# Patient Record
Sex: Male | Born: 1980 | Race: White | Hispanic: No | Marital: Single | Smoking: Current every day smoker
Health system: Southern US, Community
[De-identification: ages and names within clinical notes are randomized; demographics above are authoritative.]

## PROBLEM LIST (undated history)

## (undated) DIAGNOSIS — G459 Transient cerebral ischemic attack, unspecified: Principal | ICD-10-CM

---

## 2011-05-05 ENCOUNTER — Emergency Department: Payer: Self-pay | Admitting: Emergency Medicine

## 2011-08-13 ENCOUNTER — Emergency Department: Payer: Self-pay | Admitting: Emergency Medicine

## 2012-04-22 ENCOUNTER — Emergency Department: Payer: Self-pay | Admitting: Emergency Medicine

## 2012-07-19 ENCOUNTER — Emergency Department: Payer: Self-pay | Admitting: Emergency Medicine

## 2013-03-31 ENCOUNTER — Emergency Department: Payer: Self-pay | Admitting: Emergency Medicine

## 2013-12-30 ENCOUNTER — Emergency Department: Payer: Self-pay | Admitting: Emergency Medicine

## 2013-12-30 LAB — URINALYSIS, COMPLETE
BILIRUBIN, UR: NEGATIVE
BLOOD: NEGATIVE
GLUCOSE, UR: NEGATIVE mg/dL (ref 0–75)
Leukocyte Esterase: NEGATIVE
Nitrite: NEGATIVE
PH: 5 (ref 4.5–8.0)
Protein: NEGATIVE
RBC,UR: 2 /HPF (ref 0–5)
Specific Gravity: 1.031 (ref 1.003–1.030)
Squamous Epithelial: NONE SEEN
WBC UR: 1 /HPF (ref 0–5)

## 2014-10-16 ENCOUNTER — Emergency Department: Payer: Self-pay | Admitting: Emergency Medicine

## 2014-10-16 LAB — CBC WITH DIFFERENTIAL/PLATELET
Basophil #: 0.1 10*3/uL (ref 0.0–0.1)
Basophil %: 0.7 %
EOS PCT: 3.8 %
Eosinophil #: 0.5 10*3/uL (ref 0.0–0.7)
HCT: 48.7 % (ref 40.0–52.0)
HGB: 16.4 g/dL (ref 13.0–18.0)
Lymphocyte #: 2.5 10*3/uL (ref 1.0–3.6)
Lymphocyte %: 18.4 %
MCH: 30.5 pg (ref 26.0–34.0)
MCHC: 33.6 g/dL (ref 32.0–36.0)
MCV: 91 fL (ref 80–100)
MONOS PCT: 6.2 %
Monocyte #: 0.8 x10 3/mm (ref 0.2–1.0)
NEUTROS ABS: 9.5 10*3/uL — AB (ref 1.4–6.5)
NEUTROS PCT: 70.9 %
Platelet: 239 10*3/uL (ref 150–440)
RBC: 5.37 10*6/uL (ref 4.40–5.90)
RDW: 12.4 % (ref 11.5–14.5)
WBC: 13.4 10*3/uL — AB (ref 3.8–10.6)

## 2014-10-16 LAB — COMPREHENSIVE METABOLIC PANEL
ALK PHOS: 52 U/L
Albumin: 4.2 g/dL (ref 3.4–5.0)
Anion Gap: 7 (ref 7–16)
BUN: 9 mg/dL (ref 7–18)
Bilirubin,Total: 0.5 mg/dL (ref 0.2–1.0)
CO2: 29 mmol/L (ref 21–32)
CREATININE: 0.96 mg/dL (ref 0.60–1.30)
Calcium, Total: 9.3 mg/dL (ref 8.5–10.1)
Chloride: 103 mmol/L (ref 98–107)
EGFR (African American): >60
EGFR (Non-African Amer.): >60
Glucose: 118 mg/dL — ABNORMAL HIGH (ref 65–99)
Osmolality: 277 (ref 275–301)
Potassium: 3.7 mmol/L (ref 3.5–5.1)
SGOT(AST): 27 U/L (ref 15–37)
SGPT (ALT): 51 U/L
Sodium: 139 mmol/L (ref 136–145)
Total Protein: 7.8 g/dL (ref 6.4–8.2)

## 2014-10-16 LAB — LIPASE, BLOOD: Lipase: 81 U/L (ref 73–393)

## 2015-08-16 ENCOUNTER — Emergency Department
Admission: EM | Admit: 2015-08-16 | Discharge: 2015-08-16 | Disposition: A | Discharge status: Home / Self Care | Payer: Self-pay | Attending: Emergency Medicine | Admitting: Emergency Medicine

## 2015-08-16 DIAGNOSIS — R197 Diarrhea, unspecified: Secondary | ICD-10-CM | POA: Insufficient documentation

## 2015-08-16 DIAGNOSIS — R109 Unspecified abdominal pain: Secondary | ICD-10-CM | POA: Insufficient documentation

## 2015-08-16 DIAGNOSIS — Z0289 Encounter for other administrative examinations: Secondary | ICD-10-CM | POA: Insufficient documentation

## 2015-08-16 DIAGNOSIS — R112 Nausea with vomiting, unspecified: Secondary | ICD-10-CM | POA: Insufficient documentation

## 2015-08-16 DIAGNOSIS — L709 Acne, unspecified: Secondary | ICD-10-CM | POA: Insufficient documentation

## 2015-08-16 NOTE — ED Provider Notes (Signed)
[facility] Emergency Department Provider Note  ____________________________________________  Time seen: Approximately 8:16 PM  I have reviewed the triage vital signs and the nursing notes.   HISTORY  Chief Complaint Abdominal Cramping; Nausea; Acne; and Letter for School/Work    HPI Francis Roberts is a 34 y.o. male patient requests to return to work no secondary to have abdominal cramping this morning. Patient states the cramping has resolved but still having some residual nausea. Patient said he had 2 episodes of vomiting and diarrhea earlier this morning but none this evening. Patient states they wouldn't tolerate foods and fluids just feel weak patient denies any fevers chills associated with this complaint. No palliative measures taken for this complaint.   No past medical history on file.  There are no active problems to display for this patient.   No past surgical history on file.  No current outpatient prescriptions on file.  Allergies Review of patient's allergies indicates no known allergies.  No family history on file.  Social History Social History  Substance Use Topics  . Smoking status: Not on file  . Smokeless tobacco: Not on file  . Alcohol Use: Not on file    Review of Systems Constitutional: No fever/chills Eyes: No visual changes. ENT: No sore throat. Cardiovascular: Denies chest pain. Respiratory: Denies shortness of breath. Gastrointestinal:abdominal pain, nausea, vomiting and diarrhea.  No constipation. Genitourinary: Negative for dysuria. Musculoskeletal: Negative for back pain. Skin: Negative for rash. Neurological: Negative for headaches, focal weakness or numbness. Allergic/Immunilogical: 10-point ROS otherwise negative.  ____________________________________________   PHYSICAL EXAM:  VITAL SIGNS: ED Triage Vitals  Enc Vitals Group     BP 08/16/15 1933 133/83 mmHg     Pulse Rate 08/16/15 1933 85     Resp  08/16/15 1933 18     Temp 08/16/15 1933 98.4 F (36.9 C)     Temp Source 08/16/15 1933 Oral     SpO2 08/16/15 1933 98 %     Weight 08/16/15 1933 165 lb (74.844 kg)     Height 08/16/15 1933 5\' 9"  (1.753 m)     Head Cir --      Peak Flow --      Pain Score 08/16/15 1933 4     Pain Loc --      Pain Edu? --      Excl. in [place]? --     Constitutional: Alert and oriented. Well appearing and in no acute distress. Eyes: Conjunctivae are normal. PERRL. EOMI. Head: Atraumatic. Nose: No congestion/rhinnorhea. Mouth/Throat: Mucous membranes are moist.  Oropharynx non-erythematous. Neck: No stridor.  No cervical spine tenderness to palpation. Hematological/Lymphatic/Immunilogical: No cervical lymphadenopathy. Cardiovascular: Normal rate, regular rhythm. Grossly normal heart sounds.  Good peripheral circulation. Respiratory: Normal respiratory effort.  No retractions. Lungs CTAB. Gastrointestinal: Soft and nontender. No distention. No abdominal bruits. No CVA tenderness. Musculoskeletal: No lower extremity tenderness nor edema.  No joint effusions. Neurologic:  Normal speech and language. No gross focal neurologic deficits are appreciated. No gait instability. Skin:  Skin is warm, dry and intact. No rash noted. Psychiatric: Mood and affect are normal. Speech and behavior are normal.  ____________________________________________   LABS (all labs ordered are listed, but only abnormal results are displayed)  Labs Reviewed - No data to display ____________________________________________  EKG   ____________________________________________  RADIOLOGY   ____________________________________________   PROCEDURES  Procedure(s) performed: None  Critical Care performed: No  ____________________________________________   INITIAL IMPRESSION / ASSESSMENT AND PLAN / ED COURSE  Pertinent  labs & imaging results that were available during my care of the patient were reviewed by me and  considered in my medical decision making (see chart for details).  Nonspecific abdominal pain. Patient given a return to work note. Patient advised follow-up with "clinic. ____________________________________________   FINAL CLINICAL IMPRESSION(S) / ED DIAGNOSES  Final diagnoses:  Pain, abdominal, unknown etiology      Sable Feil, PA-C 08/16/15 2026  Carrie Mew, MD 08/16/15 2049

## 2015-08-16 NOTE — ED Notes (Signed)
Pt to ED states he missed work this morning for abdominal cramping this morning that caused him to miss work and has since been feeling better and that he needs a work note. Pt also requesting to have two places on his face evaluated where acne has been and is still raised.

## 2015-10-31 ENCOUNTER — Emergency Department
Admission: EM | Admit: 2015-10-31 | Discharge: 2015-10-31 | Disposition: A | Discharge status: Home / Self Care | Payer: Self-pay | Attending: Emergency Medicine | Admitting: Emergency Medicine

## 2015-10-31 ENCOUNTER — Encounter: Payer: Self-pay | Admitting: Emergency Medicine

## 2015-10-31 ENCOUNTER — Emergency Department: Payer: Self-pay

## 2015-10-31 DIAGNOSIS — F17210 Nicotine dependence, cigarettes, uncomplicated: Secondary | ICD-10-CM | POA: Insufficient documentation

## 2015-10-31 DIAGNOSIS — J069 Acute upper respiratory infection, unspecified: Secondary | ICD-10-CM | POA: Insufficient documentation

## 2015-10-31 DIAGNOSIS — R0602 Shortness of breath: Secondary | ICD-10-CM

## 2015-10-31 DIAGNOSIS — R202 Paresthesia of skin: Secondary | ICD-10-CM | POA: Insufficient documentation

## 2015-10-31 DIAGNOSIS — D170 Benign lipomatous neoplasm of skin and subcutaneous tissue of head, face and neck: Secondary | ICD-10-CM | POA: Insufficient documentation

## 2015-10-31 LAB — COMPREHENSIVE METABOLIC PANEL
ALBUMIN: 4.6 g/dL (ref 3.5–5.0)
ALT: 51 U/L (ref 17–63)
AST: 38 U/L (ref 15–41)
Alkaline Phosphatase: 51 U/L (ref 38–126)
Anion gap: 12 (ref 5–15)
BUN: 7 mg/dL (ref 6–20)
CHLORIDE: 104 mmol/L (ref 101–111)
CO2: 23 mmol/L (ref 22–32)
CREATININE: 1 mg/dL (ref 0.61–1.24)
Calcium: 10 mg/dL (ref 8.9–10.3)
GFR calc Af Amer: >60 mL/min (ref >60)
GLUCOSE: 93 mg/dL (ref 65–99)
Potassium: 3.4 mmol/L — ABNORMAL LOW (ref 3.5–5.1)
Sodium: 139 mmol/L (ref 135–145)
Total Bilirubin: 0.9 mg/dL (ref 0.3–1.2)
Total Protein: 7.8 g/dL (ref 6.5–8.1)

## 2015-10-31 LAB — CBC
HCT: 50.4 % (ref 40.0–52.0)
Hemoglobin: 17 g/dL (ref 13.0–18.0)
MCH: 29.8 pg (ref 26.0–34.0)
MCHC: 33.8 g/dL (ref 32.0–36.0)
MCV: 88.1 fL (ref 80.0–100.0)
PLATELETS: 240 10*3/uL (ref 150–440)
RBC: 5.72 MIL/uL (ref 4.40–5.90)
RDW: 12.4 % (ref 11.5–14.5)
WBC: 17.1 10*3/uL — AB (ref 3.8–10.6)

## 2015-10-31 LAB — TROPONIN I

## 2015-10-31 MED ORDER — BENZONATATE 100 MG PO CAPS
100.0000 mg | ORAL_CAPSULE | Freq: Four times a day (QID) | ORAL | Status: DC | PRN
Start: 2015-10-31 — End: 2016-07-01

## 2015-10-31 MED ORDER — AZITHROMYCIN 250 MG PO TABS: ORAL_TABLET | ORAL | Status: AC

## 2015-10-31 MED ORDER — ALBUTEROL SULFATE HFA 108 (90 BASE) MCG/ACT IN AERS: 2.0000 | INHALATION_SPRAY | Freq: Four times a day (QID) | RESPIRATORY_TRACT | Status: DC | PRN

## 2015-10-31 NOTE — Discharge Instructions (Signed)
Please drink plenty of fluid and stay well hydrated. Please practice frequent and good handwashing to prevent the spread of infection. Next  Please take the albuterol inhaler and the Tessalon Perles to decrease cough.   Please return to the emergency department if he developed chest pain, shortness of breath, inability to keep down fluids, or any other symptoms concerning to you.

## 2015-10-31 NOTE — ED Provider Notes (Signed)
[facility] Emergency Department Provider Note  ____________________________________________  Time seen: Approximately 6:53 PM  I have reviewed the triage vital signs and the nursing notes.   HISTORY  Chief Complaint Shortness of Breath; Nasal Congestion; and Numbness    HPI Francis Roberts is a 34 y.o. male with ongoing tobacco abuse presenting with several days of cough productive of yellow sputum, congestion and rhinorrhea, and shortness of breath. Patient reports that this morning shortness of breath was worse in the hot environment of his workplace at [employer], so he came here for evaluation. As he noticed that he was short of breath, he started to get worried and began to breathe very fast, and then he developed tingling of both hands, both feet, and the upper lip. This has completely resolved. He denies any lightheadedness or syncope. He has had occasional midsternal chest "tightness" that occurs with the cough. He had one day of sore throat which has completely resolved. He denies any ear pain. He denies any fever or chills. No myalgias.   History reviewed. No pertinent past medical history.  There are no active problems to display for this patient.   History reviewed. No pertinent past surgical history.  Current Outpatient Rx  Name  Route  Sig  Dispense  Refill  . albuterol (PROVENTIL HFA;VENTOLIN HFA) 108 (90 BASE) MCG/ACT inhaler   Inhalation   Inhale 2 puffs into the lungs every 6 (six) hours as needed for wheezing or shortness of breath.   1 Inhaler   0   . azithromycin (ZITHROMAX Z-PAK) 250 MG tablet      Take 2 tablets (500 mg) on  Day 1,  followed by 1 tablet (250 mg) once daily on Days 2 through 5.   6 each   0   . benzonatate (TESSALON PERLES) 100 MG capsule   Oral   Take 1 capsule (100 mg total) by mouth every 6 (six) hours as needed for cough.   30 capsule   0     Allergies Review of patient's allergies indicates no known  allergies.  No family history on file.  Social History Social History  Substance Use Topics  . Smoking status: Current Every Day Smoker    Types: Cigarettes  . Smokeless tobacco: None  . Alcohol Use: No    Review of Systems Constitutional: No fever/chills. No lightheadedness or syncope. Eyes: No visual changes. ENT: Positive sore throat now resolved. Cardiovascular: Positive chest tightness but no chest pain or palpitations. Respiratory: Positive shortness of breath.  Positive productive cough. Gastrointestinal: No abdominal pain.  No nausea, no vomiting.  No diarrhea.  No constipation. Genitourinary: Negative for dysuria. Musculoskeletal: Negative for back pain. Skin: Negative for rash. Neurological: Negative for headaches, focal weakness or numbness. Tingling perioral, bilateral hands, bilateral feet.  10-point ROS otherwise negative.  ____________________________________________   PHYSICAL EXAM:  VITAL SIGNS: ED Triage Vitals  Enc Vitals Group     BP 10/31/15 1726 128/74 mmHg     Pulse Rate 10/31/15 1726 89     Resp 10/31/15 1726 22     Temp 10/31/15 1726 98.6 F (37 C)     Temp Source 10/31/15 1726 Oral     SpO2 10/31/15 1726 97 %     Weight 10/31/15 1726 169 lb (76.658 kg)     Height 10/31/15 1726 5\' 9"  (1.753 m)     Head Cir --      Peak Flow --      Pain Score  10/31/15 1822 0     Pain Loc --      Pain Edu? --      Excl. in [place]? --     Constitutional: Alert and oriented. Well appearing and in no acute distress. Answer question appropriately. Eyes: Conjunctivae are normal.  EOMI. no discharge. EARS: TMs are clear without bulge, erythema or fluid bilaterally. Canals are clear. Head: Atraumatic. Nose: Positive congestion, no rhinorrhea. Mouth/Throat: Mucous membranes are moist. No posterior pharyngeal erythema, tonsillar swelling or exudate. Uvula is midline.  Neck: No stridor.  Supple.  No JVD. Trachea is midline. Cardiovascular: Normal rate, regular  rhythm. No murmurs, rubs or gallops.  Respiratory: Normal respiratory effort.  No retractions. Lungs CTAB.  No wheezes, rales or ronchi. Gastrointestinal: Soft and nontender. No distention. No peritoneal signs. Musculoskeletal: No LE edema. No calf pain, palpable cords or Homans sign. Neurologic:  Normal speech and language. No gross focal neurologic deficits are appreciated.  Skin:  Skin is warm, dry and intact. No rash noted. Psychiatric: Mood and affect are normal. Speech and behavior are normal.  Normal judgement.  ____________________________________________   LABS (all labs ordered are listed, but only abnormal results are displayed)  Labs Reviewed  CBC - Abnormal; Notable for the following:    WBC 17.1 (*)    All other components within normal limits  COMPREHENSIVE METABOLIC PANEL - Abnormal; Notable for the following:    Potassium 3.4 (*)    All other components within normal limits  TROPONIN I   ____________________________________________  EKG  ED ECG REPORT I, Eula Listen, the attending physician, personally viewed and interpreted this ECG.   Date: 10/31/2015  EKG Time: 1731  Rate: 83  Rhythm: normal sinus rhythm  Axis: normal  Intervals:none  ST&T Change: No ST elevation. Nonspecific T-wave inversion in V1.  ____________________________________________  RADIOLOGY  Dg Chest 2 View  10/31/2015  CLINICAL DATA:  Cough, shortness of breath for several days. EXAM: CHEST  2 VIEW COMPARISON:  None. FINDINGS: The heart size and mediastinal contours are within normal limits. Both lungs are clear. No pneumothorax or pleural effusion is noted. The visualized skeletal structures are unremarkable. IMPRESSION: No active cardiopulmonary disease. Electronically Signed   By: Marijo Conception, M.D.   On: 10/31/2015 17:49    ____________________________________________   PROCEDURES  Procedure(s) performed: None  Critical Care performed:  No ____________________________________________   INITIAL IMPRESSION / ASSESSMENT AND PLAN / ED COURSE  Pertinent labs & imaging results that were available during my care of the patient were reviewed by me and considered in my medical decision making (see chart for details).  34 y.o. male with ongoing tobacco abuse presenting with several days of cough and cold symptoms without fever, positive shortness of breath. Here, the patient has 97% oxygen saturation with a normal respiratory rate, no retractions or accessory muscle use. I do not hear any abnormal sounds in his lungs and his chest x-ray does not show pneumonia; however, given his productive cough and shortness of breath all treat him with a Z-Pak. He understands that the etiology of his symptoms is most likely viral and that it is possible the Z-Pak will not improve his symptoms. He understands return precautions and follow-up instructions.  ____________________________________________  FINAL CLINICAL IMPRESSION(S) / ED DIAGNOSES  Final diagnoses:  Upper respiratory infection  Shortness of breath  Lipoma of forehead  Tingling in extremities      NEW MEDICATIONS STARTED DURING THIS VISIT:  New Prescriptions   ALBUTEROL (PROVENTIL HFA;VENTOLIN  HFA) 108 (90 BASE) MCG/ACT INHALER    Inhale 2 puffs into the lungs every 6 (six) hours as needed for wheezing or shortness of breath.   AZITHROMYCIN (ZITHROMAX Z-PAK) 250 MG TABLET    Take 2 tablets (500 mg) on  Day 1,  followed by 1 tablet (250 mg) once daily on Days 2 through 5.   BENZONATATE (TESSALON PERLES) 100 MG CAPSULE    Take 1 capsule (100 mg total) by mouth every 6 (six) hours as needed for cough.     Eula Listen, MD 10/31/15 1902

## 2015-10-31 NOTE — ED Notes (Signed)
Pt reports nasal congestion and shortness of breath x "a few days", reports bilateral hand and foot numbness today. Pt ambulatory to triage.

## 2015-11-01 MED ORDER — OXYCODONE-ACETAMINOPHEN 5-325 MG PO TABS
ORAL_TABLET | ORAL | Status: AC
  Filled 2015-11-01: qty 2

## 2016-07-01 ENCOUNTER — Encounter: Payer: Self-pay | Admitting: Emergency Medicine

## 2016-07-01 ENCOUNTER — Emergency Department
Admission: EM | Admit: 2016-07-01 | Discharge: 2016-07-01 | Disposition: A | Discharge status: Home / Self Care | Payer: Self-pay | Attending: Emergency Medicine | Admitting: Emergency Medicine

## 2016-07-01 DIAGNOSIS — Y9389 Activity, other specified: Secondary | ICD-10-CM | POA: Insufficient documentation

## 2016-07-01 DIAGNOSIS — Y999 Unspecified external cause status: Secondary | ICD-10-CM | POA: Insufficient documentation

## 2016-07-01 DIAGNOSIS — W51XXXA Accidental striking against or bumped into by another person, initial encounter: Secondary | ICD-10-CM | POA: Insufficient documentation

## 2016-07-01 DIAGNOSIS — S39012A Strain of muscle, fascia and tendon of lower back, initial encounter: Secondary | ICD-10-CM | POA: Insufficient documentation

## 2016-07-01 DIAGNOSIS — Y929 Unspecified place or not applicable: Secondary | ICD-10-CM | POA: Insufficient documentation

## 2016-07-01 DIAGNOSIS — F17210 Nicotine dependence, cigarettes, uncomplicated: Secondary | ICD-10-CM | POA: Insufficient documentation

## 2016-07-01 MED ORDER — KETOROLAC TROMETHAMINE 60 MG/2ML IM SOLN
60.0000 mg | Freq: Once | INTRAMUSCULAR | Status: AC
  Administered 2016-07-01: 60 mg via INTRAMUSCULAR
  Filled 2016-07-01: qty 2

## 2016-07-01 MED ORDER — HYDROCODONE-ACETAMINOPHEN 5-325 MG PO TABS: 1.0000 | ORAL_TABLET | ORAL | Status: DC | PRN

## 2016-07-01 MED ORDER — NAPROXEN 500 MG PO TABS: 500.0000 mg | ORAL_TABLET | Freq: Two times a day (BID) | ORAL | Status: DC

## 2016-07-01 MED ORDER — CYCLOBENZAPRINE HCL 10 MG PO TABS: 10.0000 mg | ORAL_TABLET | Freq: Three times a day (TID) | ORAL | Status: DC | PRN

## 2016-07-01 NOTE — Discharge Instructions (Signed)
Lumbosacral Strain °Lumbosacral strain is a strain of any of the parts that make up your lumbosacral vertebrae. Your lumbosacral vertebrae are the bones that make up the lower third of your backbone. Your lumbosacral vertebrae are held together by muscles and tough, fibrous tissue (ligaments).  °CAUSES  °A sudden blow to your back can cause lumbosacral strain. Also, anything that causes an excessive stretch of the muscles in the low back can cause this strain. This is typically seen when people exert themselves strenuously, fall, lift heavy objects, bend, or crouch repeatedly. °RISK FACTORS °· Physically demanding work. °· Participation in pushing or pulling sports or sports that require a sudden twist of the back (tennis, golf, baseball). °· Weight lifting. °· Excessive lower back curvature. °· Forward-tilted pelvis. °· Weak back or abdominal muscles or both. °· Tight hamstrings. °SIGNS AND SYMPTOMS  °Lumbosacral strain may cause pain in the area of your injury or pain that moves (radiates) down your leg.  °DIAGNOSIS °Your health care provider can often diagnose lumbosacral strain through a physical exam. In some cases, you may need tests such as X-ray exams.  °TREATMENT  °Treatment for your lower back injury depends on many factors that your clinician will have to evaluate. However, most treatment will include the use of anti-inflammatory medicines. °HOME CARE INSTRUCTIONS  °· Avoid hard physical activities (tennis, racquetball, waterskiing) if you are not in proper physical condition for it. This may aggravate or create problems. °· If you have a back problem, avoid sports requiring sudden body movements. Swimming and walking are generally safer activities. °· Maintain good posture. °· Maintain a healthy weight. °· For acute conditions, you may put ice on the injured area. °¨ Put ice in a plastic bag. °¨ Place a towel between your skin and the bag. °¨ Leave the ice on for 20 minutes, 2-3 times a day. °· When the  low back starts healing, stretching and strengthening exercises may be recommended. °SEEK MEDICAL CARE IF: °· Your back pain is getting worse. °· You experience severe back pain not relieved with medicines. °SEEK IMMEDIATE MEDICAL CARE IF:  °· You have numbness, tingling, weakness, or problems with the use of your arms or legs. °· There is a change in bowel or bladder control. °· You have increasing pain in any area of the body, including your belly (abdomen). °· You notice shortness of breath, dizziness, or feel faint. °· You feel sick to your stomach (nauseous), are throwing up (vomiting), or become sweaty. °· You notice discoloration of your toes or legs, or your feet get very cold. °MAKE SURE YOU:  °· Understand these instructions. °· Will watch your condition. °· Will get help right away if you are not doing well or get worse. °  °This information is not intended to replace advice given to you by your health care provider. Make sure you discuss any questions you have with your health care provider. °  °Document Released: 09/10/2005 Document Revised: 12/22/2014 Document Reviewed: 07/20/2013 °Elsevier Interactive Patient Education ©2016 [employer] ° °Muscle Strain °A muscle strain is an injury that occurs when a muscle is stretched beyond its normal length. Usually a small number of muscle fibers are torn when this happens. Muscle strain is rated in degrees. First-degree strains have the least amount of muscle fiber tearing and pain. Second-degree and third-degree strains have increasingly more tearing and pain.  °Usually, recovery from muscle strain takes 1-2 weeks. Complete healing takes 5-6 weeks.  °CAUSES  °Muscle strain happens when   a sudden, violent force placed on a muscle stretches it too far. This may occur with lifting, sports, or a fall.  °RISK FACTORS °Muscle strain is especially common in athletes.  °SIGNS AND SYMPTOMS °At the site of the muscle strain, there may  be: °· Pain. °· Bruising. °· Swelling. °· Difficulty using the muscle due to pain or lack of normal function. °DIAGNOSIS  °Your health care provider will perform a physical exam and ask about your medical history. °TREATMENT  °Often, the best treatment for a muscle strain is resting, icing, and applying cold compresses to the injured area.   °HOME CARE INSTRUCTIONS  °· Use the PRICE method of treatment to promote muscle healing during the first 2-3 days after your injury. The PRICE method involves: °¨ Protecting the muscle from being injured again. °¨ Restricting your activity and resting the injured body part. °¨ Icing your injury. To do this, put ice in a plastic bag. Place a towel between your skin and the bag. Then, apply the ice and leave it on from 15-20 minutes each hour. After the third day, switch to moist heat packs. °¨ Apply compression to the injured area with a splint or elastic bandage. Be careful not to wrap it too tightly. This may interfere with blood circulation or increase swelling. °¨ Elevate the injured body part above the level of your heart as often as you can. °· Only take over-the-counter or prescription medicines for pain, discomfort, or fever as directed by your health care provider. °· Warming up prior to exercise helps to prevent future muscle strains. °SEEK MEDICAL CARE IF:  °· You have increasing pain or swelling in the injured area. °· You have numbness, tingling, or a significant loss of strength in the injured area. °MAKE SURE YOU:  °· Understand these instructions. °· Will watch your condition. °· Will get help right away if you are not doing well or get worse. °  °This information is not intended to replace advice given to you by your health care provider. Make sure you discuss any questions you have with your health care provider. °  °Document Released: 12/01/2005 Document Revised: 09/21/2013 Document Reviewed: 06/30/2013 °Elsevier Interactive Patient Education ©2016 Elsevier  Inc. ° °

## 2016-07-01 NOTE — ED Provider Notes (Signed)
[facility] Emergency Department Provider Note  ____________________________________________  Time seen: Approximately 8:26 AM  I have reviewed the triage vital signs and the nursing notes.   HISTORY  Chief Complaint Back Pain    HPI Francis Roberts is a 35 y.o. male presents for evaluation of lower back pain. Patient states that he was playing around with his young son jumped on his back yesterday complains of increased pain radiating down both legs. States his pains as 10 over 10 at this time exacerbated with walking.   History reviewed. No pertinent past medical history.  There are no active problems to display for this patient.   History reviewed. No pertinent past surgical history.  Current Outpatient Rx  Name  Route  Sig  Dispense  Refill  . albuterol (PROVENTIL HFA;VENTOLIN HFA) 108 (90 BASE) MCG/ACT inhaler   Inhalation   Inhale 2 puffs into the lungs every 6 (six) hours as needed for wheezing or shortness of breath.   1 Inhaler   0   . cyclobenzaprine (FLEXERIL) 10 MG tablet   Oral   Take 1 tablet (10 mg total) by mouth 3 (three) times daily as needed for muscle spasms.   30 tablet   0   . HYDROcodone-acetaminophen (NORCO) 5-325 MG tablet   Oral   Take 1-2 tablets by mouth every 4 (four) hours as needed for moderate pain.   10 tablet   0   . naproxen (NAPROSYN) 500 MG tablet   Oral   Take 1 tablet (500 mg total) by mouth 2 (two) times daily with a meal.   60 tablet   0     Allergies Review of patient's allergies indicates no known allergies.  No family history on file.  Social History Social History  Substance Use Topics  . Smoking status: Current Every Day Smoker    Types: Cigarettes  . Smokeless tobacco: None  . Alcohol Use: No    Review of Systems Constitutional: No fever/chills Cardiovascular: Denies chest pain. Respiratory: Denies shortness of breath. Gastrointestinal: No abdominal pain.  No nausea, no  vomiting.  No diarrhea.  No constipation. Genitourinary: Negative for dysuria. Musculoskeletal: Positive low back pain. Straight leg raise negative bilaterally Skin: Negative for rash. Neurological: Negative for headaches, focal weakness or numbness.  10-point ROS otherwise negative.  ____________________________________________   PHYSICAL EXAM:  VITAL SIGNS: ED Triage Vitals  Enc Vitals Group     BP --      Pulse --      Resp --      Temp --      Temp src --      SpO2 --      Weight --      Height --      Head Cir --      Peak Flow --      Pain Score 07/01/16 0820 10     Pain Loc --      Pain Edu? --      Excl. in [place]? --     Constitutional: Alert and oriented. Well appearing and in no acute distress.   Cardiovascular: Normal rate, regular rhythm. Grossly normal heart sounds.  Good peripheral circulation. Respiratory: Normal respiratory effort.  No retractions. Lungs CTAB. Musculoskeletal: Thoracic and lumbar paraspinal tenderness. No CVAT. Trachea leg raise negative bilaterally. Neurologic:  Normal speech and language. No gross focal neurologic deficits are appreciated. Ambulates slowly methodically secondary to lower back pain. Skin:  Skin is warm, dry and intact.  No rash noted. Psychiatric: Mood and affect are normal. Speech and behavior are normal.  ____________________________________________   LABS (all labs ordered are listed, but only abnormal results are displayed)  Labs Reviewed - No data to display ____________________________________________  EKG   ____________________________________________  RADIOLOGY  Deferred at this visit ____________________________________________   PROCEDURES  Procedure(s) performed: None  Critical Care performed: No  ____________________________________________   INITIAL IMPRESSION / ASSESSMENT AND PLAN / ED COURSE  Pertinent labs & imaging results that were available during my care of the patient were  reviewed by me and considered in my medical decision making (see chart for details).  Acute lumbar sacral strain. Rx given for Flexeril 10 mg 3 times a day, Naprosyn 500 mg twice a day. Patient follow-up with PCP or return to ER with any worsening symptomology. Work excuse 24 hours given. ____________________________________________   FINAL CLINICAL IMPRESSION(S) / ED DIAGNOSES  Final diagnoses:  Low back strain, initial encounter     This chart was dictated using voice recognition software/Dragon. Despite best efforts to proofread, errors can occur which can change the meaning. Any change was purely unintentional.   Arlyss Repress, PA-C 07/01/16 [id number]  Drenda Freeze, MD 07/01/16 [phone]

## 2016-07-01 NOTE — ED Notes (Signed)
Reports lower back pain.  States he was playing with his young con who jumped on his back yesterday, pain since

## 2016-08-12 ENCOUNTER — Emergency Department
Admission: EM | Admit: 2016-08-12 | Discharge: 2016-08-12 | Disposition: A | Discharge status: Home / Self Care | Payer: Self-pay | Attending: Emergency Medicine | Admitting: Emergency Medicine

## 2016-08-12 ENCOUNTER — Encounter: Payer: Self-pay | Admitting: Emergency Medicine

## 2016-08-12 DIAGNOSIS — F17210 Nicotine dependence, cigarettes, uncomplicated: Secondary | ICD-10-CM | POA: Insufficient documentation

## 2016-08-12 DIAGNOSIS — Z79899 Other long term (current) drug therapy: Secondary | ICD-10-CM | POA: Insufficient documentation

## 2016-08-12 DIAGNOSIS — R112 Nausea with vomiting, unspecified: Secondary | ICD-10-CM | POA: Insufficient documentation

## 2016-08-12 DIAGNOSIS — R1032 Left lower quadrant pain: Secondary | ICD-10-CM | POA: Insufficient documentation

## 2016-08-12 LAB — COMPREHENSIVE METABOLIC PANEL
ALBUMIN: 4.6 g/dL (ref 3.5–5.0)
ALT: 24 U/L (ref 17–63)
ANION GAP: 3 — AB (ref 5–15)
AST: 22 U/L (ref 15–41)
Alkaline Phosphatase: 44 U/L (ref 38–126)
BILIRUBIN TOTAL: 0.4 mg/dL (ref 0.3–1.2)
BUN: 9 mg/dL (ref 6–20)
CO2: 26 mmol/L (ref 22–32)
Calcium: 9.5 mg/dL (ref 8.9–10.3)
Chloride: 107 mmol/L (ref 101–111)
Creatinine, Ser: 0.91 mg/dL (ref 0.61–1.24)
GLUCOSE: 80 mg/dL (ref 65–99)
POTASSIUM: 3.8 mmol/L (ref 3.5–5.1)
Sodium: 136 mmol/L (ref 135–145)
TOTAL PROTEIN: 7.5 g/dL (ref 6.5–8.1)

## 2016-08-12 LAB — URINALYSIS COMPLETE WITH MICROSCOPIC (ARMC ONLY)
BILIRUBIN URINE: NEGATIVE
Bacteria, UA: NONE SEEN
Glucose, UA: NEGATIVE mg/dL
Hgb urine dipstick: NEGATIVE
KETONES UR: NEGATIVE mg/dL
Leukocytes, UA: NEGATIVE
Nitrite: NEGATIVE
PROTEIN: NEGATIVE mg/dL
RBC / HPF: NONE SEEN RBC/hpf (ref 0–5)
Specific Gravity, Urine: 1.009 (ref 1.005–1.030)
Squamous Epithelial / LPF: NONE SEEN
WBC, UA: NONE SEEN WBC/hpf (ref 0–5)
pH: 7 (ref 5.0–8.0)

## 2016-08-12 LAB — CBC
HEMATOCRIT: 46.3 % (ref 40.0–52.0)
HEMOGLOBIN: 16.5 g/dL (ref 13.0–18.0)
MCH: 30.7 pg (ref 26.0–34.0)
MCHC: 35.6 g/dL (ref 32.0–36.0)
MCV: 86.3 fL (ref 80.0–100.0)
PLATELETS: 217 10*3/uL (ref 150–440)
RBC: 5.37 MIL/uL (ref 4.40–5.90)
RDW: 12.3 % (ref 11.5–14.5)
WBC: 8.6 10*3/uL (ref 3.8–10.6)

## 2016-08-12 LAB — LIPASE, BLOOD: Lipase: 25 U/L (ref 11–51)

## 2016-08-12 MED ORDER — ONDANSETRON HCL 4 MG PO TABS
ORAL_TABLET | ORAL | Status: AC
  Administered 2016-08-12: 4 mg via ORAL
  Filled 2016-08-12: qty 1

## 2016-08-12 MED ORDER — SODIUM CHLORIDE 0.9 % IV BOLUS (SEPSIS): 1000.0000 mL | Freq: Once | INTRAVENOUS | Status: DC

## 2016-08-12 MED ORDER — ONDANSETRON HCL 4 MG PO TABS
4.0000 mg | ORAL_TABLET | Freq: Once | ORAL | Status: AC
  Administered 2016-08-12: 4 mg via ORAL

## 2016-08-12 MED ORDER — ONDANSETRON HCL 4 MG/2ML IJ SOLN
4.0000 mg | Freq: Once | INTRAMUSCULAR | Status: DC
  Filled 2016-08-12: qty 2

## 2016-08-12 NOTE — ED Notes (Signed)
Pt given water for PO challenge per MD request.

## 2016-08-12 NOTE — ED Triage Notes (Signed)
Pt presents from work with abdominal pain and n/v. He states he felt poorly yesterday and began to have RLQ pain and vomiting this morning. Pt's girlfriend had similar symptoms yesterday. NAD Noted.

## 2016-08-12 NOTE — ED Provider Notes (Signed)
[facility] Emergency Department Provider Note  _____________________________________   First MD Initiated Contact with Patient 08/12/16 1204     (approximate)  I have reviewed the triage vital signs and the nursing notes.   HISTORY  Chief Complaint Abdominal Pain and Emesis  HPI Francis Roberts is a 35 y.o. male no significant past medical history who presents to the ER after he began vomiting at 8 AM today. He had 2 episodes of vomiting followed by 4 episodes of dry heaves and no longer feels nauseous but does say that his stomach is still "in a ball". He had lower abdominal cramping first right lower quadrant than left lower quadrant and now is very minimal; right lower quadrant no longer hurts. His girlfriend had identical symptoms yesterday afternoon and slept for 4 hours and now feels better. They both had one episode of loose stool, no diarrhea. Neither reported fever. They did eat [place] food yesterday.   History reviewed. No pertinent past medical history.  There are no active problems to display for this patient.   History reviewed. No pertinent surgical history.  Prior to Admission medications   Medication Sig Start Date End Date Taking? Authorizing Provider  albuterol (PROVENTIL HFA;VENTOLIN HFA) 108 (90 BASE) MCG/ACT inhaler Inhale 2 puffs into the lungs every 6 (six) hours as needed for wheezing or shortness of breath. Patient not taking: Reported on 08/12/2016 10/31/15   Eula Listen, MD  cyclobenzaprine (FLEXERIL) 10 MG tablet Take 1 tablet (10 mg total) by mouth 3 (three) times daily as needed for muscle spasms. Patient not taking: Reported on 08/12/2016 07/01/16   Pierce Crane Beers, PA-C  HYDROcodone-acetaminophen [facility]) 5-325 MG tablet Take 1-2 tablets by mouth every 4 (four) hours as needed for moderate pain. Patient not taking: Reported on 08/12/2016 07/01/16   Pierce Crane Beers, PA-C  naproxen (NAPROSYN) 500 MG tablet Take 1 tablet (500  mg total) by mouth 2 (two) times daily with a meal. Patient not taking: Reported on 08/12/2016 07/01/16   Arlyss Repress, PA-C    Allergies Review of patient's allergies indicates no known allergies.  History reviewed. No pertinent family history.  Social History Social History  Substance Use Topics  . Smoking status: Current Every Day Smoker    Packs/day: 0.50    Types: Cigarettes  . Smokeless tobacco: Never Used  . Alcohol use No    Review of SystemsConstitutional: No fever/chills  Cardiovascular: Denies chest pain. Respiratory: Denies shortness of breath. Gastrointestinal see history of present illness Genitourinary: Negative for dysuria. Musculoskeletal: Negative for back pain. Skin: Negative for rash. Neurological: Negative for headaches10-point ROS otherwise negative.  ____________________________________________   PHYSICAL EXAM:  VITAL SIGNS: ED Triage Vitals [08/12/16 0856]  Enc Vitals Group     BP 140/83     Pulse Rate 85     Resp 18     Temp 98.4 F (36.9 C)     Temp Source Oral     SpO2 98 %     Weight 170 lb (77.1 kg)     Height 5\' 9"  (1.753 m)     Head Circumference      Peak Flow      Pain Score 6     Pain Loc      Pain Edu?      Excl. in [place]?    Constitutional: Alert and oriented. Well appearing and in no acute distress. Eyes: Conjunctivae are normal. PERRL. EOMI. Head: Atraumatic. Nose: No congestion/rhinnorhea. Mouth/Throat: Mucous membranes  are Slightly dry.  Oropharynx non-erythematous. Neck: No stridor.   Cardiovascular: Normal rate, regular rhythm. Grossly normal heart sounds.  Good peripheral circulation. Respiratory: Normal respiratory effort.  No retractions. Lungs CTAB. Gastrointestinal: Soft and minimally tender to palpation left lower quadrant. No distention. No abdominal bruits. No CVA tenderness. Musculoskeletal: No lower extremity tenderness nor edema.  No joint effusions. Neurologic:  Normal speech and language. No gross  focal neurologic deficits are appreciated. No gait instability. Skin:  Skin is warm, dry and intact. No rash noted. Psychiatric: Mood and affect are normal. Speech and behavior are normal.  ____________________________________________   LABS (all labs ordered are listed, but only abnormal results are displayed)  Labs Reviewed  COMPREHENSIVE METABOLIC PANEL - Abnormal; Notable for the following:       Result Value   Anion gap 3 (*)    All other components within normal limits  URINALYSIS COMPLETEWITH MICROSCOPIC ([facility] ONLY) - Abnormal; Notable for the following:    Color, Urine YELLOW (*)    APPearance CLEAR (*)    All other components within normal limits  LIPASE, BLOOD  CBC    Procedures_n/a___________________________________________   INITIAL IMPRESSION / ASSESSMENT AND PLAN / ED COURSE  Pertinent labs & imaging results that were available during my care of the patient were reviewed by me and considered in my medical decision making (see chart for details).  On exam, patient with no tenderness right lower quadrant, very minimal tenderness left lower quadrant. Symptoms consistent with what his girlfriend had yesterday. I strongly suspect this is viral or food borne. I do not feel the patient needs labs or imaging for further workup.  Clinical Course    ----------------------------------------- 2:04 PM on 08/12/2016 -----------------------------------------  Patient is tolerating liquids by mouth. He had previously refused IV fluids and IV Zofran as Sx have resolved & he is feeling better. He is walking around and requesting discharge.  ____________________________________________   FINAL CLINICAL IMPRESSION(S) / ED DIAGNOSES  Final diagnoses:  Non-intractable vomiting with nausea, vomiting of unspecified type      NEW MEDICATIONS STARTED DURING THIS VISIT:  New Prescriptions   No medications on file     Note:  This document was prepared using Dragon voice  recognition software and may include unintentional dictation errors.    Ponciano Ort, MD 08/12/16 1415

## 2016-09-29 ENCOUNTER — Emergency Department: Admit: 2016-09-29 | Payer: MEDICAID | Primary: Family Medicine

## 2016-09-29 ENCOUNTER — Inpatient Hospital Stay
Admit: 2016-09-29 | Discharge: 2016-09-30 | Disposition: A | Discharge status: Home / Self Care | Payer: MEDICAID | Attending: Family Medicine | Admitting: Family Medicine

## 2016-09-29 ENCOUNTER — Inpatient Hospital Stay: Admit: 2016-09-29 | Payer: MEDICAID | Primary: Family Medicine

## 2016-09-29 ENCOUNTER — Inpatient Hospital Stay

## 2016-09-29 DIAGNOSIS — I639 Cerebral infarction, unspecified: Secondary | ICD-10-CM

## 2016-09-29 LAB — EKG 12-LEAD
Atrial Rate: 96 {beats}/min
Diagnosis: NORMAL
P Axis: 64 degrees
P-R Interval: 144 ms
Q-T Interval: 362 ms
QRS Duration: 94 ms
QTc Calculation (Bazett): 457 ms
R Axis: -68 degrees
T Axis: 23 degrees
Ventricular Rate: 96 {beats}/min

## 2016-09-29 LAB — CBC WITH AUTOMATED DIFF
ABS. BASOPHILS: 0.1 10*3/uL — ABNORMAL HIGH (ref 0.0–0.06)
ABS. EOSINOPHILS: 0.1 10*3/uL (ref 0.0–0.4)
ABS. LYMPHOCYTES: 2.1 10*3/uL (ref 0.9–3.6)
ABS. MONOCYTES: 0.9 10*3/uL (ref 0.05–1.2)
ABS. NEUTROPHILS: 7.7 10*3/uL (ref 1.8–8.0)
BASOPHILS: 1 % (ref 0–2)
EOSINOPHILS: 1 % (ref 0–5)
HCT: 48.2 % — ABNORMAL HIGH (ref 36.0–48.0)
HGB: 16.7 g/dL — ABNORMAL HIGH (ref 13.0–16.0)
LYMPHOCYTES: 19 % — ABNORMAL LOW (ref 21–52)
MCH: 31.3 PG (ref 24.0–34.0)
MCHC: 34.6 g/dL (ref 31.0–37.0)
MCV: 90.3 FL (ref 74.0–97.0)
MONOCYTES: 9 % (ref 3–10)
MPV: 10.1 FL (ref 9.2–11.8)
NEUTROPHILS: 70 % (ref 40–73)
PLATELET: 314 10*3/uL (ref 135–420)
RBC: 5.34 M/uL (ref 4.70–5.50)
RDW: 13.5 % (ref 11.6–14.5)
WBC: 10.9 10*3/uL (ref 4.6–13.2)

## 2016-09-29 LAB — METABOLIC PANEL, COMPREHENSIVE
A-G Ratio: 1 (ref 0.8–1.7)
ALT (SGPT): 37 U/L (ref 16–61)
AST (SGOT): 20 U/L (ref 15–37)
Albumin: 3.9 g/dL (ref 3.4–5.0)
Alk. phosphatase: 69 U/L (ref 45–117)
Anion gap: 5 mmol/L (ref 3.0–18)
BUN/Creatinine ratio: 10 — ABNORMAL LOW (ref 12–20)
BUN: 10 MG/DL (ref 7.0–18)
Bilirubin, total: 0.4 MG/DL (ref 0.2–1.0)
CO2: 27 mmol/L (ref 21–32)
Calcium: 8.5 MG/DL (ref 8.5–10.1)
Chloride: 104 mmol/L (ref 100–108)
Creatinine: 0.96 MG/DL (ref 0.6–1.3)
GFR est AA: >60 mL/min/{1.73_m2} (ref >60)
GFR est non-AA: >60 mL/min/{1.73_m2} (ref >60)
Globulin: 4.1 g/dL — ABNORMAL HIGH (ref 2.0–4.0)
Glucose: 121 mg/dL — ABNORMAL HIGH (ref 74–99)
Potassium: 3.4 mmol/L — ABNORMAL LOW (ref 3.5–5.5)
Protein, total: 8 g/dL (ref 6.4–8.2)
Sodium: 136 mmol/L (ref 136–145)

## 2016-09-29 LAB — EKG, 12 LEAD, INITIAL
Atrial Rate: 96 {beats}/min
Calculated P Axis: 64 degrees
Calculated R Axis: -68 degrees
Calculated T Axis: 23 degrees
Diagnosis: NORMAL
P-R Interval: 144 ms
Q-T Interval: 362 ms
QRS Duration: 94 ms
QTC Calculation (Bezet): 457 ms
Ventricular Rate: 96 {beats}/min

## 2016-09-29 LAB — LIPID PANEL
CHOL/HDL Ratio: 3.6 (ref 0–5.0)
Cholesterol, total: 240 MG/DL — ABNORMAL HIGH (ref <200)
HDL Cholesterol: 66 MG/DL — ABNORMAL HIGH (ref 40–60)
LDL, calculated: 122 MG/DL — ABNORMAL HIGH (ref 0–100)
Triglyceride: 260 MG/DL — ABNORMAL HIGH (ref <150)
VLDL, calculated: 52 MG/DL

## 2016-09-29 LAB — PROTHROMBIN TIME + INR
INR: 0.9 (ref 0.8–1.2)
Prothrombin time: 12 s (ref 11.5–15.2)

## 2016-09-29 LAB — PTT: aPTT: 29.2 s (ref 23.0–36.4)

## 2016-09-29 LAB — GLUCOSE, POC: Glucose (POC): 118 mg/dL — ABNORMAL HIGH (ref 70–110)

## 2016-09-29 LAB — HEMOGLOBIN A1C W/O EAG: Hemoglobin A1c: 5 % (ref 4.2–5.6)

## 2016-09-29 MED ORDER — NICOTINE 21 MG/24 HR DAILY PATCH
21 mg/24 hr | Freq: Every day | TRANSDERMAL | Status: DC
Start: 2016-09-29 — End: 2016-09-30
  Administered 2016-09-30 (×2): via TRANSDERMAL

## 2016-09-29 MED ORDER — HEPARIN (PORCINE) 5,000 UNIT/ML IJ SOLN
5000 unit/mL | Freq: Three times a day (TID) | INTRAMUSCULAR | Status: DC
Start: 2016-09-29 — End: 2016-09-30

## 2016-09-29 MED ORDER — IOPAMIDOL 76 % IV SOLN
370 mg iodine /mL (76 %) | Freq: Once | INTRAVENOUS | Status: AC
Start: 2016-09-29 — End: 2016-09-29
  Administered 2016-09-29: 17:00:00 via INTRAVENOUS

## 2016-09-29 MED ORDER — SODIUM CHLORIDE 0.9 % IV
Freq: Once | INTRAVENOUS | Status: AC
Start: 2016-09-29 — End: 2016-09-29
  Administered 2016-09-29: 15:00:00 via INTRAVENOUS

## 2016-09-29 MED ORDER — POTASSIUM CHLORIDE SR 20 MEQ TAB, PARTICLES/CRYSTALS
20 mEq | ORAL | Status: AC
Start: 2016-09-29 — End: 2016-09-29
  Administered 2016-09-29: 21:00:00 via ORAL

## 2016-09-29 MED ORDER — SODIUM CHLORIDE 0.9 % IJ SYRG
Freq: Three times a day (TID) | INTRAMUSCULAR | Status: DC
Start: 2016-09-29 — End: 2016-09-30
  Administered 2016-09-29 – 2016-09-30 (×3): via INTRAVENOUS

## 2016-09-29 MED ORDER — ASPIRIN 325 MG TAB
325 mg | Freq: Every day | ORAL | Status: DC
Start: 2016-09-29 — End: 2016-09-30
  Administered 2016-09-30: 13:00:00 via ORAL

## 2016-09-29 MED ORDER — ATORVASTATIN 40 MG TAB
40 mg | Freq: Every evening | ORAL | Status: DC
Start: 2016-09-29 — End: 2016-09-30
  Administered 2016-09-30: 01:00:00 via ORAL

## 2016-09-29 MED ORDER — ASPIRIN 81 MG CHEWABLE TAB
81 mg | ORAL | Status: AC
Start: 2016-09-29 — End: 2016-09-29
  Administered 2016-09-29: 14:00:00 via ORAL

## 2016-09-29 MED ORDER — SODIUM CHLORIDE 0.9 % IJ SYRG
INTRAMUSCULAR | Status: DC | PRN
Start: 2016-09-29 — End: 2016-09-30

## 2016-09-29 MED FILL — SODIUM CHLORIDE 0.9 % IV: INTRAVENOUS | Qty: 1000

## 2016-09-29 MED FILL — BD POSIFLUSH NORMAL SALINE 0.9 % INJECTION SYRINGE: INTRAMUSCULAR | Qty: 10

## 2016-09-29 MED FILL — NICOTINE 21 MG/24 HR DAILY PATCH: 21 mg/24 hr | TRANSDERMAL | Qty: 1

## 2016-09-29 MED FILL — ASPIRIN 81 MG CHEWABLE TAB: 81 mg | ORAL | Qty: 4

## 2016-09-29 MED FILL — HEPARIN (PORCINE) 5,000 UNIT/ML IJ SOLN: 5000 unit/mL | INTRAMUSCULAR | Qty: 1

## 2016-09-29 MED FILL — ISOVUE-370  76 % INTRAVENOUS SOLUTION: 370 mg iodine /mL (76 %) | INTRAVENOUS | Qty: 100

## 2016-09-29 MED FILL — POTASSIUM CHLORIDE SR 20 MEQ TAB, PARTICLES/CRYSTALS: 20 mEq | ORAL | Qty: 2

## 2016-09-29 NOTE — Progress Notes (Signed)
Chaplain conducted an initial consultation and Spiritual Assessment for [name] Bishop, who is a 35 y.o.,male. Patient???s Primary Language is: [place].   According to the patient???s EMR Religious Affiliation is: [place].     The reason the Patient came to the hospital is:   Patient Active Problem List    Diagnosis Date Noted   ??? TIA (transient ischemic attack) 09/29/2016   ??? Myocardial infarct 08/04/2011        The Chaplain provided the following Interventions:  Initiated a relationship of care and support.   Explored issues of faith, belief, spirituality and religious/ritual needs while hospitalized.  Listened empathically to stories of his life and medical challenge today.   Provided information about Spiritual Care Services.  Offered prayer and assurance of continued prayers on patient's behalf.   Chart reviewed.    The following outcomes where achieved:  Patient shared limited information about both their medical narrative and spiritual journey/beliefs.  Chaplain confirmed Patient's Religious Affiliation.  Patient processed feeling about current hospitalization.  Patient expressed gratitude for chaplain's visit.    Assessment:  Patient does not have any religious/cultural needs that will affect patient???s preferences in health care.  There are no spiritual or religious issues which require intervention at this time.     Plan:  Chaplains will continue to follow and will provide pastoral care on an as needed/requested basis.  Chaplain recommends bedside caregivers page chaplain on duty if patient shows signs of acute spiritual or emotional distress.      Maurine MinisterCharles Yoko Mcgahee, [facility]  Chaplain  Spiritual Care  [phone]

## 2016-09-29 NOTE — H&P (Signed)
Admission H&P dictated.  ID#: [id number]: [id number].

## 2016-09-29 NOTE — Other (Signed)
TRANSFER - IN REPORT:    Verbal report received from PattenBrenda, RN(name) on [name] Bishop  being received from ED(unit) for routine progression of care      Report consisted of patient???s Situation, Background, Assessment and   Recommendations(SBAR).     Information from the following report(s) SBAR, Kardex, ED Summary and Recent Results was reviewed with the receiving nurse.    Opportunity for questions and clarification was provided.      Assessment completed upon patient???s arrival to unit and care assumed.

## 2016-09-29 NOTE — H&P (Signed)
[facility]OUT[facility]    Name:  [name] Bishop, [name] Bishop  MR#:  [id number]  DOB:  19-Mar-1981  Account #:  [id number]  Date of Adm:  09/29/2016      CHIEF COMPLAINT: Left arm weakness.    HISTORY OF PRESENT ILLNESS: The patient is a 35 year old  gentleman with a reported history of 2 myocardial infarctions on no  medications, who came to the emergency department with transient  left arm weakness, which has since resolved. This morning while he  was doing yard work around 9 or 9:30 a.m., he felt like all of a sudden  he could not move his upper part of the body. This lasted for a few  moments, but there afterwards, he started to have some difficulty  involving his left arm. It felt weak, he had some difficulty with  coordination, for example, when he went to go reach for a bottle to  drink from, he was not able to coordinate his arm to grip it. He has  never had these symptoms before. He also noticed a little bit of  numbness and tingling involving the left fingertips. He has no other  complaints, he is back to baseline. He had no weakness, no change in  speech, no difficulty swallowing, no facial asymmetry. His fiancee is at  the bedside, reports that the patient otherwise appears to be at  baseline.    He had a CT of his head performed in the emergency department, and  this by report shows no acute. Tele-Neurology has evaluated this  patient.    REVIEW OF SYSTEMS: As above. No recent illnesses. Again, he  denies any other complaints at the current time, as his symptoms have  resolved. He denies any headaches or visual changes. No dizziness  or lightheadedness, any fevers or chills, nausea, vomiting, chest pain,  shortness of breath, cough, wheeze, abdominal pain, dysuria,  hematuria, polyuria, oliguria, constipation, diarrhea, melena,  hematochezia, rash, bruise, bleed or hot or cold intolerance. No  change in terms of balance or difficulty walking. No other weakness.   Review otherwise negative 10 element review.    ALLERGIES: NO KNOWN DRUG ALLERGIES.    MEDICATIONS: None.    MEDICAL HISTORY: He reports having 2 MIs.    PAST SURGICAL HISTORY: Denies.    FAMILY HISTORY: Denies.    SOCIAL HISTORY: He smokes about a pack of cigarettes and has  been doing so for 24 years. Occasionally drinks alcohol, 1 or 2 beers a  day. No history of any withdrawal. No other drug use.    PHYSICAL EXAMINATION:  VITAL SIGNS: Last set of vitals includes a temperature of 97.0, pulse  91, pressure 164/99, respiratory rate 24, saturating at 98% on room  air.  GENERAL: The patient is a well-developed, well-nourished gentleman  who is awake, alert and oriented and in no distress.  HEENT: Head is normocephalic and atraumatic. Pupils equal, round  and reactive to light. Extraocular motion intact. Nares are patent on  both sides, posterior pharynx is clear, mucous membranes are moist,  tongue is midline.  NECK: Supple, no lymphadenopathy.  CARDIOVASCULAR: Regular rate and rhythm. No murmur, rubs  or gallops.  PULMONARY: Clear to auscultation bilaterally.  ABDOMEN: Soft, nontender, nondistended with normal bowel sounds.  No masses are felt.  EXTREMITIES: 5/5 motor strength x4. No calf tenderness, no edema,  2+ radial and pedal pulses.  NEUROLOGIC: Cranial nerves 2 through 12 grossly intact.    LABORATORY  DATA: Include a metabolic panel with a sodium of 136,  potassium of 3.4, chloride 104, CO2 27, BUN 10, creatinine 0.96,  glucose 121, calcium 8.5. Liver function tests normal with a globulin of  4.1.    He had a lipid panel performed showing total cholesterol of 240,  triglycerides 260, HDL 66, LDL calculated at 161.    CBC within normal limits with an hemoglobin and hematocrit of  16.7/48.2.    CT of the head by report shows no evidence of intracranial  hemorrhages or any other definable acute intracranial process. No  definable focal abnormality in brain.     He had a CTA of his head and neck with and without contrast; report  shows no evidence of dissection, stenosis or any other definable  abnormality in the common carotid and internal carotid arteries of both  sides and vertebral arteries of both sides in neck. Normal cerebral  CTA. No evidence of any compromise in cerebral anterior and  posterior circulations.    There is an EKG in the chart as well. This shows normal sinus rhythm,  rate of 96. I do not see any ST or T-wave changes suggestive of  ischemia or infarct. I do not have a prior to compare to.    IMPRESSION: A 35 year old gentleman with no significant past  medical history, apart from prior myocardial infarction, on no medical  therapy, who is here with cerebrovascular accident versus transient  ischemic attack, admitted under cerebrovascular accident/transient  ischemic attack protocol.    PLAN:  1. Possible transient ischemic attack: Symptoms have completely  resolved. He was seen by Tele-Neurology. MRI has been ordered. He  is on a stroke protocol. Will start an aspirin as well as statin therapy.  Permissive hypertension. Neurology has been consulted by the  emergency department.  2. Deep venous thrombosis prophylaxis in the form of heparin 5000  units subcutaneously every 8 hours.  3. We will arrange for outpatient followup with a new primary care  physician.        Dillon Bjork, MD    PP / JRH  D:  09/29/2016   13:40  T:  09/29/2016   14:06  Job #:  [id number]

## 2016-09-29 NOTE — Progress Notes (Signed)
Problem: Dysphagia (Adult)  Goal: *Acute Goals and Plan of Care (Insert Text)  Patient will:  1. Tolerate PO trials with 0 s/s overt distress in 4/5 trials  2. Utilize compensatory swallow strategies/maneuvers (decrease bite/sip, size/rate, alt. liq/sol) with min cues in 4/5 trials    Rec:   Reg solid with thin liquids  Aspiration precautions  HOB >45 during po intake, remain >30 for 30-45 minutes after po   Small bites/sips; alternate liquid/solid with slow feeding rate   Oral care TID  Meds per pt preference  SPEECH LANGUAGE PATHOLOGY BEDSIDE SWALLOW EVALUATION     Patient: [name] Bishop (35 y.o. male)  Date: 09/29/2016  Primary Diagnosis: TIA (transient ischemic attack)        Precautions: aspiration         ASSESSMENT :  Based on the objective data described below, the patient presents with oropharyngeal swallow fxn essentially WFL. Strength, ROM, and coordination of the orofacial musculature were all found to be [facility]. All structures were intact and symmetrical. The pt presented with adequate oral transit times on all consistencies. Mastication time was appropriate. No s/sx of aspiration noted; hyolaryngeal elevation and excursion appeared adequate on all consistencies. No oral stasis noted post swallow. The pt denied globus sensation post swallow. Pt and family with c/o occasionally coughing with PO x 1 week; however, no coughing noted this PM. Speech production was fluent. No dysarthria was observed. Expressive/receptive speech production appeared WFL to informal observation. Pt communicated in sentences of appropriate form, content, and use. ST will continue to follow x 1-2 visits to ensure safety of diet tolerance.     Patient will benefit from skilled intervention to address the above impairments.  Patient???s rehabilitation potential is considered to be Good  Factors which may influence rehabilitation potential include:   [X]             None noted       PLAN :   Recommendations and Planned Interventions: See above  Frequency/Duration: Patient will be followed by speech-language pathology x 1-2 more visits to address goals.  Discharge Recommendations: Outpatient and To Be Determined       SUBJECTIVE:   Patient stated ???I did notice that I was coughing a little bit earlier this week, but I feel fine now???.      OBJECTIVE:       Past Medical History:   Diagnosis Date   ??? Myocardial infarct     History reviewed. No pertinent surgical history.  Prior Level of Function/Home Situation: unknown  Diet prior to admission: reg with thin  Current Diet:  Reg with thin   Cognitive and Communication Status:  Neurologic State: Alert  Orientation Level: Oriented X4  Cognition: Follows commands  Oral Assessment:  Oral Assessment  Labial: No impairment  Dentition: Natural;Intact  Oral Hygiene: Adequate  Lingual: No impairment  Velum: No impairment  Mandible: No impairment  P.O. Trials:  Patient Position: 90 at side of bed  Vocal quality prior to P.O.: No impairment  Consistency Presented: Thin liquid;Puree;Solid  How Presented: Self-fed/presented;Cup/sip;Spoon;Straw  Bolus Acceptance: No impairment  Bolus Formation/Control: No impairment  Propulsion: No impairment  Oral Residue: None  Initiation of Swallow: No impairment  Laryngeal Elevation: Functional  Aspiration Signs/Symptoms: None  Pharyngeal Phase Characteristics: No impairment, issues, or problems   Effective Modifications: None  Cues for Modifications: None     Oral Phase Severity: No impairment  Pharyngeal Phase Severity : No impairment     GCODESwallowing: F6213 Swallow Current  Status CI= 1-19%  G8997 Swallow Goal Status CH= 0%     The severity rating is based on the following outcomes:             Clinical Judgment     Pain:  Pt c/o 0/10 pain prior to evaluation.  Pt c/o 0/10 pain post evaluation.     After treatment:   [ ]             Patient left in no apparent distress sitting up in chair   [X]             Patient left in no apparent distress in bed  [X]             Call bell left within reach  [X]             Nursing notified  [ ]             Caregiver present  [ ]             Bed alarm activated      COMMUNICATION/EDUCATION:   [X]             SLP educated pt with regard to compensatory swallow strategies and                  aspiration/reflux precautions including: small bites/sips,                  alternate liquids/solids, decrease feeding rate, HOB > 45 with all po, and                             upright in bed at 30 degrees after po for at least 45 minutes.   [X]             Patient/family have participated as able in goal setting and plan of care.     Thank you for this referral.     Wendall MolaLauren Deserie Dirks, M.S. CCC-SLP/L  Speech-Language Pathologist

## 2016-09-29 NOTE — Other (Signed)
Bedside and Verbal shift change report given to Lori, Rn (oncoming nurse) by Alice, RN (offgoing nurse). Report included the following information SBAR, Kardex and Recent Results.

## 2016-09-29 NOTE — Consults (Signed)
[name] Bishop is a 35 y.o., left handed male, with an established history of heart disease status post myocardial infarction some 9 years ago, hypertension noncompliant with his medications, who is in his usual state of health when while working today he noted that his left arm became clenched and spasmed on him.  It was as if he had no control over it for about 15 seconds and is doing what it wanted to.  It became tightened up in flexion and he could use it.  Then for the next 15 minutes his right arm would not do what he asked it to.  He was weak and floppy.  As the strength was coming back to sound as if the arm was moving but he did not feel correct.  There is if someone else's arm.  He denies any speech difficulty.  He denies any symptoms on the right side.  He denies any visual changes.  He does not have any symptoms in the left leg.  Feels back to his current baseline.  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    Social History; he is single lives with his fianc??e and their 5 children.  He smokes one half packs of cigarettes per day.  Drinks 2-3 beers daily.  No illicit drugs that he does have a history of prescription medication abuse back in 2014 was the last time he used.  Works in [employer].    Family History; he has no family history of stroke or heart disease.    Current Facility-Administered Medications   Medication Dose Route Frequency Provider Last Rate Last Dose   ??? atorvastatin (LIPITOR) tablet 80 mg  80 mg Oral QHS Marilynn Latino, MD       ??? sodium chloride (NS) flush 5-10 mL  5-10 mL IntraVENous Q8H Eden Emms, MD       ??? sodium chloride (NS) flush 5-10 mL  5-10 mL IntraVENous PRN Eden Emms, MD       ??? [START ON 09/30/2016] aspirin (ASPIRIN) tablet 325 mg  325 mg Oral DAILY Eden Emms, MD       ??? heparin (porcine) injection 5,000 Units  5,000 Units SubCUTAneous Q8H Eden Emms, MD        ??? potassium chloride (K-DUR, KLOR-CON) SR tablet 40 mEq  40 mEq Oral NOW Eden Emms, MD           Past Medical History:   Diagnosis Date   ??? Myocardial infarct        History reviewed. No pertinent surgical history.    No Known Allergies    Patient Active Problem List   Diagnosis Code   ??? Myocardial infarct I21.9   ??? TIA (transient ischemic attack) G45.9         Review of Systems:   As above otherwise 11 point review of systems negative including;   Constitutional no fever or chills  Skin denies rash or itching  HENT  Denies tinnitus, hearing lose  Eyes denies diplopia vision lose  Respiratory denies shortness of breath  Cardiovascular denies chest pain, dyspnea on exertion  Gastrointestinal denies nausea, vomiting, diarrhea, constipation  Genitourinary denies incontinence  Musculoskeletal denies joint pain or swelling  Endocrine denies weight change  Hematology denies easy bruising or bleeding   Neurological as above in HPI      PHYSICAL EXAMINATION:      VITAL SIGNS:  Visit Vitals   ??? BP (!) 164/99 (BP 1 Location: Left arm, BP Patient Position: At rest)   ??? Pulse 91   ??? Temp 97 ??F (36.1 ??C)   ??? Resp 24   ??? Ht 5' 10.5" (1.791 m)   ??? Wt 93 kg (205 lb)   ??? SpO2 98%   ??? BMI 29 kg/m2       GENERAL: The patient is well developed, well nourished, and in no apparent distress.   EXTREMITIES: No clubbing, cyanosis, or edema is identified.  Pulses 2+ and symmetrical.  Muscle tone is normal.  HEAD:   Ear, nose, and throat appear to be without trauma.  The patient is normocephalic.    NEUROLOGIC EXAMINATION    MENTAL STATUS: The patient is awake, alert, and oriented x 4.  Fund of knowledge is adequate.  Speech is fluent and memory appears to be intact, both long and short term.    CRANIAL NERVES: II ??? Visual fields are full to confrontation. Funduscopic examination reveals flat disks bilaterally. Pupils are both 4 mm and briskly reactive to light and accommodation.    III, IV, VI ??? Extraocular movements are intact and there is no nystagmus.   V ??? Facial sensation is intact to pinprick and light touch.  VII ??? Face is symmetrical.   VIII - Hearing is present.   IX, X, XII??? Palate rises symmetrically. Gag is present. Tongue is in the midline.      XI - Shoulder shrugging and head turning intact  MOTOR:  The patient is 5/5 in all four limbs without any drift. Fine finger movements are symmetrical.  Isolated motor group testing reveals no focal abnormalities.  Tone is normal.  Sensory examination is intact to pinprick, light touch and position sense testing.  Reflexes are 2+ and symmetrical. Plantars are down going. Cerebellar examination reveals no gross ataxia or dysmetria. Gait is normal and the patient can tandem walk without any difficulty.       Final result (Exam End: 09/29/2016 ??9:36 AM) Open    Study Result     CT scan of head, without contrast:  ??  ??  ??  HISTORY:[  ??  New onset of weakness in bilateral extremities since this morning.  ??  History of myocardial infarction.  ??  TECHNIQUE:  ??  Contiguous 5 mm thick axial sections of brain have been obtained without  intravenous contrast.  ??  ??  [2-D coronal and sagittal images reformatted.]   ??  The study has been performed utilizing appropriate radiation dose reduction  technique according to specification of the scanner, with modification of MAA/KV  and appropriate collimation adjusted to patient's body habitus.  ??  ????  COMPARISON STUDY: [None]  ??  ??  -------------------------------------  ??  ??  FINDINGS:  ??  ??  ??  Intracranial hemorrhage :[None.]  ??  Intracranial mass lesion:[ None.]  ??  Midline shift: [None.]  ??  Cerebral cortical atrophy:[ None].  ??  Cerebral central atrophy:[ None.]  ??  Chronic microvascular ischemic changes/aging changes in white matter:[ None.]  ??  Acute cortical infarction:[ None.]  ??  Old cerebral infarction: [None.]  ??  Basal ganglia structures of both sides:[ No diagnostic finding.]  ??   Brainstem:[ No diagnostic  finding]  ??  Cerebellum: No diagnostic finding.  ??  Calvarium and base of skull:[ No fracture or focal lesion].  ??  In visualized portions of both orbits:[ No diagnostic finding.]  ??  In visualized portions of paranasal sinuses:[ No diagnostic finding.]  ??  In visualized base of l skull:[ No diagnostic finding.]  ??  ??  IMPRESSION  IMPRESSION:  ??  No evidence of intracranial hemorrhages or any other definable acute  intracranial process. No definable focal abnormality in brain.  ??  The findings have been reported by me to ER physician Dr. Marilynn Latino , at [phone]  hours on 09/29/2016.         I have reviewed the above imagines myself.       CBC:   Lab Results   Component Value Date/Time    WBC 10.9 09/29/2016 09:50 AM    RBC 5.34 09/29/2016 09:50 AM    HGB 16.7 09/29/2016 09:50 AM    HCT 48.2 09/29/2016 09:50 AM    PLATELET 314 09/29/2016 09:50 AM     BMP:   Lab Results   Component Value Date/Time    Glucose 121 09/29/2016 09:50 AM    Sodium 136 09/29/2016 09:50 AM    Potassium 3.4 09/29/2016 09:50 AM    Chloride 104 09/29/2016 09:50 AM    CO2 27 09/29/2016 09:50 AM    BUN 10 09/29/2016 09:50 AM    Creatinine 0.96 09/29/2016 09:50 AM    Calcium 8.5 09/29/2016 09:50 AM     CMP:   Lab Results   Component Value Date/Time    Glucose 121 09/29/2016 09:50 AM    Sodium 136 09/29/2016 09:50 AM    Potassium 3.4 09/29/2016 09:50 AM    Chloride 104 09/29/2016 09:50 AM    CO2 27 09/29/2016 09:50 AM    BUN 10 09/29/2016 09:50 AM    Creatinine 0.96 09/29/2016 09:50 AM    Calcium 8.5 09/29/2016 09:50 AM    Anion gap 5 09/29/2016 09:50 AM    BUN/Creatinine ratio 10 09/29/2016 09:50 AM    Alk. phosphatase 69 09/29/2016 09:50 AM    Protein, total 8.0 09/29/2016 09:50 AM    Albumin 3.9 09/29/2016 09:50 AM    Globulin 4.1 09/29/2016 09:50 AM    A-G Ratio 1.0 09/29/2016 09:50 AM     Coagulation:   Lab Results   Component Value Date/Time    Prothrombin time 12.0 09/29/2016 09:50 AM    INR 0.9 09/29/2016 09:50 AM     aPTT 29.2 09/29/2016 09:50 AM     Cardiac markers: No results found for: CPK, CKND1, MYO       Impression: Young man with established vascular disease who was admitted with a left hemiparesis but was intermittent in its nature possible TIA versus seizure in this man who has risk factors including prior heart attack continued tobacco abuse and hypertension.  He also is medically noncompliant.    Plan: Need to see what his MRI scan of the brain shows.  Also needs an EEG to see if this.  Admits to stroke for close observation.    PLEASE NOTE:   This document has been produced using voice recognition software. Unrecognized errors in transcription may be present.

## 2016-09-29 NOTE — Progress Notes (Signed)
Completed echocardiogram. Report to follow. Patient transported  Back to room.

## 2016-09-29 NOTE — ED Notes (Signed)
Report given to RN & transported to 416.

## 2016-09-29 NOTE — Progress Notes (Addendum)
Problem: Mobility Impaired (Adult and Pediatric)  Goal: *Acute Goals and Plan of Care (Insert Text)  Physical Therapy Goals  Initiated 09/29/2016 and to be accomplished within 1 day (All Met Upon Evaluation)  1. Patient will move from supine to sit and sit to supine , scoot up and down and roll side to side in bed with independence.   2. Patient will transfer from bed to chair and chair to bed with independence using the least restrictive device.  3. Patient will perform sit to stand with independence.  4. Patient will ambulate with independence for 200 feet with the least restrictive device.   5. Patient will ascend/descend 6 stairs with 0 handrail(s) with independence.  PHYSICAL THERAPY EVALUATION & DISCHARGE     Patient: [name] Bishop (35 y.o. male)  Date: 09/29/2016  Primary Diagnosis: TIA (transient ischemic attack)  Precautions: None currently      ASSESSMENT AND RECOMMENDATIONS:  Based on the objective data described below, the patient presents with appropriate balance and coordination. Patient demonstrates appropriate management of ambulatory, transfer mobility, and stair negotiation tasks required to ensure adequate functional independence in home. Patient educated on modifiable risk factors; reports that he is aware of the risks of smoking and would be willing to try quitting if he was provided a prescription for an aid to for smoking cessation. Patient appropriate for return to home at this time w/o need for follow up PT services.  Skilled physical therapy is not indicated at this time.  Discharge Recommendations: None  Further Equipment Recommendations for Discharge: None        G-CODES:      Mobility G8978 Current  CH= 0%  G8979 Goal  CH= 0% G8980 D/C  CH= 0%.  The severity rating is based on the Level of Assistance required for Functional Mobility and ADLs.         SUBJECTIVE:   Patient stated ???I am hungry, I want her to go get me some food.??? Patient  reports that he has been in the hospital since 0900 and would like for his spouse to get some food for him.      OBJECTIVE DATA SUMMARY:       Past Medical History:   Diagnosis Date   ??? Myocardial infarct     History reviewed. No pertinent surgical history.  Barriers to Learning/Limitations: None  Compensate with: visual, verbal, tactile, kinesthetic cues/model  Prior Level of Function/Home Situation: Independence in home and community working in [employer].  Strength:    Strength: Within functional limits     RLE Assessment (WDL): Exceptions to WDL  RLE Strength  R Hip Flexion: 5  R Hip ABduction: 5  R Knee Flexion: 5  R Knee Extension: 5  R Ankle Dorsiflexion: 5     LLE Assessment (WDL): Exception to WDL  LLE Strength  L Hip Flexion: 5  L Hip ABduction: 5  L Knee Flexion: 5  L Knee Extension: 5  L Ankle Dorsiflexion: 5  Tone & Sensation:   Tone: Normal  Sensation: Intact  RLE Assessment (WDL): Exceptions to WDL  LLE Assessment (WDL): Exception to WDL  Range Of Motion:  AROM: Within functional limits  RLE Assessment (WDL): Exceptions to WDL  PROM: Within functional limits  LLE Assessment (WDL): Exception to WDL  Functional Mobility:  Bed Mobility:  Rolling: Independent  Supine to Sit: Independent  Sit to Supine: Independent  Scooting: Independent  Transfers:  Sit to Stand: Independent  Stand to Sit: Independent  Bed  to Chair: Independent  Other: Patient performs stand step transfer without need for physical assistance or verbal cueing; patient completes activity without compesated technique or need for SPV x 5 trials.  Balance:   Sitting: Intact  Standing: Intact  Ambulation/Gait Training:  Gait Description (WDL): Within defined limits   Patient ambulates 300 ft with independence requiring no physical assistance or verbal cueing for safety.  Functional assessment:   Patient able to transition standing to tall kneeling via half kneeling x 4  w/o UE support or evidence of balance/coordination deficit including no evidence of functional weakness. Though patient does note "I feel that side is worked out"  Stairs: Patient negotiates 10 steps (endurance not maximally challenged) with no UE support and reciprocal pattern requiring no physical assistance at independent level.  Pain:  Pt reports 0/10 pain or discomfort prior to treatment.    Pt reports 0/10 pain or discomfort post treatment.      Activity Tolerance:   Excellent  Please refer to the flowsheet for vital signs taken during this treatment.  After treatment:   [X]          Patient left in no apparent distress sitting up in chair  [ ]          Patient left in no apparent distress in bed  [X]          Call bell left within reach  [X]          Nursing notified  [X]          Caregiver present  [ ]          Bed alarm activated      COMMUNICATION/EDUCATION:   [X]          Fall prevention education was provided and the patient/caregiver indicated understanding.  [X]          Patient/family have participated as able in goal setting and plan of care.  [X]          Patient/family agree to work toward stated goals and plan of care.  [ ]          Patient understands intent and goals of therapy, but is neutral about his/her participation.  [ ]          Patient is unable to participate in goal setting and plan of care.     Thank you for this referral.  Tobe Sos P. Zapf, PT DPT   Time Calculation: 17 mins

## 2016-09-29 NOTE — ED Triage Notes (Signed)
Tingling on left arm and face 30 min prior to arrival. hand didn't register with brain. Now has headache.   DR Loel DubonnetFan with pt at desk, code s called. @0930 . To CT [phone]

## 2016-09-29 NOTE — ED Provider Notes (Signed)
HPI Comments: Hx of 2x prior MIs, here with 30 minutes of L arm weakness. He was trying to do work in the yard when he was having difficulty using his L arm, and also later could not reach and grab objects or text. Some tingling as well. No other extrem sx, but there was some brief headache. No prior hx of migraines, no prior CVAs.     Patient is a 35 y.o. male presenting with numbness and headaches.   Numbness   Associated symptoms include headaches. Pertinent negatives include no shortness of breath and no chest pain.   Headache   Associated symptoms include headaches. Pertinent negatives include no chest pain and no shortness of breath.        Past Medical History:   Diagnosis Date   ??? Myocardial infarct ([place])        No past surgical history on file.      No family history on file.    Social History     Social History   ??? Marital status: SINGLE     Spouse name: N/A   ??? Number of children: N/A   ??? Years of education: N/A     Occupational History   ??? Not on file.     Social History Main Topics   ??? Smoking status: Not on file   ??? Smokeless tobacco: Not on file   ??? Alcohol use Not on file   ??? Drug use: Not on file   ??? Sexual activity: Not on file     Other Topics Concern   ??? Not on file     Social History Narrative   ??? No narrative on file         ALLERGIES: Review of patient's allergies indicates no known allergies.    Review of Systems   Respiratory: Negative for shortness of breath.    Cardiovascular: Negative for chest pain.   Musculoskeletal: Negative for back pain.   Neurological: Positive for numbness and headaches.   All other systems reviewed and are negative.      Vitals:    09/29/16 0945 09/29/16 1000 09/29/16 1015 09/29/16 1036   BP: (!) 179/108 (!) 156/100 (!) 165/100 (!) 165/100   Pulse: 93 95 99 (!) 102   Resp: _0 Temp:    97 ??F (36.1 ??C)   SpO2: 99% 100% 100% 100%   Weight:    93 kg (205 lb)   Height:    5' 10.5" (1.791 m)            Physical Exam    Constitutional: He is oriented to person, place, and time. He appears well-developed.   HENT:   Head: Normocephalic and atraumatic.   Eyes: Conjunctivae and EOM are normal.   Neck: Normal range of motion.   Cardiovascular: Normal rate and normal heart sounds.  Exam reveals no gallop and no friction rub.    No murmur heard.  Pulmonary/Chest: Effort normal and breath sounds normal. No stridor.   Abdominal: Soft. There is no tenderness.   Musculoskeletal: Normal range of motion. He exhibits no tenderness.   Neurological: He is alert and oriented to person, place, and time.   Equal grips bilaterally, normal SILT throughout, no ataxia, equal leg raise   Skin: Skin is warm and dry. He is not diaphoretic.   Psychiatric: He has a normal mood and affect. His behavior is normal.   Nursing note and vitals reviewed.  MDM  Number of Diagnoses or Management Options  Diagnosis management comments: Code S called- focal neuro sx 30 min ago in back ground of prior MIs- but no a fib. Now resolving- possible TIA.    ED Course       Procedures                       Vitals:  Patient Vitals for the past 12 hrs:   Temp Pulse Resp BP SpO2   09/29/16 1036 97 ??F (36.1 ??C) (!) 102 17 (!) 165/100 100 %   09/29/16 1015 - 99 21 (!) 165/100 100 %   09/29/16 1000 - 95 21 (!) 156/100 100 %   09/29/16 0945 - 93 26 (!) 179/108 99 %       Medications ordered:   Medications   atorvastatin (LIPITOR) tablet 80 mg (not administered)   aspirin chewable tablet 324 mg (324 mg Oral Given 09/29/16 1024)   0.9% sodium chloride infusion (100 mL/hr IntraVENous New Bag 09/29/16 1030)         Lab findings:  Recent Results (from the past 12 hour(s))   GLUCOSE, POC    Collection Time: 09/29/16  9:25 AM   Result Value Ref Range    Glucose (POC) 118 (H) 70 - 110 mg/dL   CBC WITH AUTOMATED DIFF    Collection Time: 09/29/16  9:50 AM   Result Value Ref Range    WBC 10.9 4.6 - 13.2 K/uL    RBC 5.34 4.70 - 5.50 M/uL    HGB 16.7 (H) 13.0 - 16.0 g/dL     HCT 48.2 (H) 36.0 - 48.0 %    MCV 90.3 74.0 - 97.0 FL    MCH 31.3 24.0 - 34.0 PG    MCHC 34.6 31.0 - 37.0 g/dL    RDW 13.5 11.6 - 14.5 %    PLATELET 314 135 - 420 K/uL    MPV 10.1 9.2 - 11.8 FL    NEUTROPHILS 70 40 - 73 %    LYMPHOCYTES 19 (L) 21 - 52 %    MONOCYTES 9 3 - 10 %    EOSINOPHILS 1 0 - 5 %    BASOPHILS 1 0 - 2 %    ABS. NEUTROPHILS 7.7 1.8 - 8.0 K/UL    ABS. LYMPHOCYTES 2.1 0.9 - 3.6 K/UL    ABS. MONOCYTES 0.9 0.05 - 1.2 K/UL    ABS. EOSINOPHILS 0.1 0.0 - 0.4 K/UL    ABS. BASOPHILS 0.1 (H) 0.0 - 0.06 K/UL    DF AUTOMATED     METABOLIC PANEL, COMPREHENSIVE    Collection Time: 09/29/16  9:50 AM   Result Value Ref Range    Sodium 136 136 - 145 mmol/L    Potassium 3.4 (L) 3.5 - 5.5 mmol/L    Chloride 104 100 - 108 mmol/L    CO2 27 21 - 32 mmol/L    Anion gap 5 3.0 - 18 mmol/L    Glucose 121 (H) 74 - 99 mg/dL    BUN 10 7.0 - 18 MG/DL    Creatinine 0.96 0.6 - 1.3 MG/DL    BUN/Creatinine ratio 10 (L) 12 - 20      GFR est AA >60 >60 ml/min/1.33m    GFR est non-AA >60 >60 ml/min/1.728m   Calcium 8.5 8.5 - 10.1 MG/DL    Bilirubin, total 0.4 0.2 - 1.0 MG/DL    ALT (SGPT) 37 16 - 61 U/L  AST (SGOT) 20 15 - 37 U/L    Alk. phosphatase 69 45 - 117 U/L    Protein, total 8.0 6.4 - 8.2 g/dL    Albumin 3.9 3.4 - 5.0 g/dL    Globulin 4.1 (H) 2.0 - 4.0 g/dL    A-G Ratio 1.0 0.8 - 1.7     PROTHROMBIN TIME + INR    Collection Time: 09/29/16  9:50 AM   Result Value Ref Range    Prothrombin time 12.0 11.5 - 15.2 sec    INR 0.9 0.8 - 1.2     PTT    Collection Time: 09/29/16  9:50 AM   Result Value Ref Range    aPTT 29.2 23.0 - 36.4 SEC       EKG interpretation by ED Physician:  Rate 96, NSR, LAD, nonspecific ST    X-Ray, CT or other radiology findings or impressions:  CT HEAD WO CONT   Final Result      XR CHEST PORT    (Results Pending)   CTA HEAD NECK W WO CONT    (Results Pending)   MRI BRAIN WO CONT    (Results Pending)       Progress notes, Consult notes or additional Procedure notes:        9:36 AM Consult: I discussed care with Dr. Bernette Mayers (tele neuro).  It was a standard discussion including patient history, chief complaint, available diagnostic results, and predicted treatment course. He will consult     10:05 AM Consult: I discussed care with Dr. Bernette Mayers (tele neuro).  It was a standard discussion including patient history, chief complaint, available diagnostic results, and predicted treatment course.  Per him, in a white male with left arm sx, shaking, and noncompliance with his MI care- need to check for carotid stenosis, so will send for CTA head/neck, aspirin, admit for further work up.     10:29 AM Consult: I discussed care with Dr. Gretchen Portela (neuro).  It was a standard discussion including patient history, chief complaint, available diagnostic results, and predicted treatment course.  He will consult      10:35 AM Consult: I discussed care with Dr. Shearon Stalls (hospitalist).  It was a standard discussion including patient history, chief complaint, available diagnostic results, and predicted treatment course. He will admit      Upon my evaluation, this patient had a high probability of imminent or life-threatening deterioration due to stroke/TIA, which required my direct attention, intervention, and personal management.    I have personally provided 30 minutes of critical care time exclusive of time spent on separately billable procedures. Time includes review of laboratory data, radiology results, discussion with consultants, and monitoring for potential decompensation. Interventions were performed as documented above.    Marilynn Latino, MD  10:40 AM          Disposition:  Diagnosis:   1. Transient cerebral ischemia, unspecified type    2. Left arm weakness        Disposition: Admitted    Follow-up Information     None           Patient's Medications   Start Taking    No medications on file   Continue Taking    CIPROFLOXACIN (CIPRO) 500 MG TABLET    Take  by mouth two (2) times a day.       HYDROCODONE-ACETAMINOPHEN (VICODIN) 5-500 MG PER TABLET    Take  by mouth.      IBUPROFEN (MOTRIN) 600 MG  TABLET    Take  by mouth every six (6) hours as needed.      IBUPROFEN (MOTRIN) 800 MG TABLET    Take  by mouth.      NAPROXEN (NAPROSYN) 500 MG TABLET    Take 500 mg by mouth two (2) times daily (with meals).      OXYCODONE-ACETAMINOPHEN (PERCOCET) 5-325 MG PER TABLET    Take 1 Tab by mouth every four (4) hours as needed.      OXYCODONE-ACETAMINOPHEN (PERCOCET) 7.5-325 MG PER TABLET    Take  by mouth every four (4) hours as needed.      PROMETHAZINE (PHENERGAN) 12.5 MG TABLET    Take  by mouth every six (6) hours as needed.      PROMETHAZINE (PHENERGAN) 25 MG TABLET    Take 25 mg by mouth every six (6) hours as needed.     These Medications have changed    No medications on file   Stop Taking    No medications on file

## 2016-09-29 NOTE — Consults (Signed)
[name] Bishop is a 35 y.o., left handed male, with an established history of heart disease status post myocardial infarction some 9 years ago, hypertension noncompliant with his medications, who is in his usual state of health when while working today he noted that his left arm became clenched and spasmed on him.  It was as if he had no control over it for about 15 seconds and is doing what it wanted to.  It became tightened up in flexion and he could use it.  Then for the next 15 minutes his right arm would not do what he asked it to.  He was weak and floppy.  As the strength was coming back to sound as if the arm was moving but he did not feel correct.  There is if someone else's arm.  He denies any speech difficulty.  He denies any symptoms on the right side.  He denies any visual changes.  He does not have any symptoms in the left leg.  Feels back to his current baseline.  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    Social History; he is single lives with his fianc??e and their 5 children.  He smokes one half packs of cigarettes per day.  Drinks 2-3 beers daily.  No illicit drugs that he does have a history of prescription medication abuse back in 2014 was the last time he used.  Works in [employer].    Family History; he has no family history of stroke or heart disease.    Current Facility-Administered Medications   Medication Dose Route Frequency Provider Last Rate Last Dose   ??? atorvastatin (LIPITOR) tablet 80 mg  80 mg Oral QHS Marilynn Latino, MD       ??? sodium chloride (NS) flush 5-10 mL  5-10 mL IntraVENous Q8H Eden Emms, MD       ??? sodium chloride (NS) flush 5-10 mL  5-10 mL IntraVENous PRN Eden Emms, MD       ??? [START ON 09/30/2016] aspirin (ASPIRIN) tablet 325 mg  325 mg Oral DAILY Eden Emms, MD       ??? heparin (porcine) injection 5,000 Units  5,000 Units SubCUTAneous Q8H Eden Emms, MD       ??? potassium chloride (K-DUR,  KLOR-CON) SR tablet 40 mEq  40 mEq Oral NOW Eden Emms, MD           Past Medical History:   Diagnosis Date   ??? Myocardial infarct        History reviewed. No pertinent surgical history.    No Known Allergies    Patient Active Problem List   Diagnosis Code   ??? Myocardial infarct I21.9   ??? TIA (transient ischemic attack) G45.9         Review of Systems:   As above otherwise 11 point review of systems negative including;   Constitutional no fever or chills  Skin denies rash or itching  HENT  Denies tinnitus, hearing lose  Eyes denies diplopia vision lose  Respiratory denies shortness of breath  Cardiovascular denies chest pain, dyspnea on exertion  Gastrointestinal denies nausea, vomiting, diarrhea, constipation  Genitourinary denies incontinence  Musculoskeletal denies joint pain or swelling  Endocrine denies weight change  Hematology denies easy bruising or bleeding   Neurological as above in HPI      PHYSICAL EXAMINATION:      VITAL SIGNS:  Visit Vitals   ??? BP (!) 164/99 (BP 1 Location: Left arm, BP Patient Position: At rest)   ??? Pulse 91   ??? Temp 97 ??F (36.1 ??C)   ??? Resp 24   ??? Ht 5' 10.5" (1.791 m)   ??? Wt 93 kg (205 lb)   ??? SpO2 98%   ??? BMI 29 kg/m2       GENERAL: The patient is well developed, well nourished, and in no apparent distress.   EXTREMITIES: No clubbing, cyanosis, or edema is identified.  Pulses 2+ and symmetrical.  Muscle tone is normal.  HEAD:   Ear, nose, and throat appear to be without trauma.  The patient is normocephalic.    NEUROLOGIC EXAMINATION    MENTAL STATUS: The patient is awake, alert, and oriented x 4.  Fund of knowledge is adequate.  Speech is fluent and memory appears to be intact, both long and short term.    CRANIAL NERVES: II ??? Visual fields are full to confrontation. Funduscopic examination reveals flat disks bilaterally. Pupils are both 4 mm and briskly reactive to light and accommodation.   III, IV, VI ??? Extraocular movements are intact and there is no nystagmus.   V ???  Facial sensation is intact to pinprick and light touch.  VII ??? Face is symmetrical.   VIII - Hearing is present.   IX, X, XII??? Palate rises symmetrically. Gag is present. Tongue is in the midline.      XI - Shoulder shrugging and head turning intact  MOTOR:  The patient is 5/5 in all four limbs without any drift. Fine finger movements are symmetrical.  Isolated motor group testing reveals no focal abnormalities.  Tone is normal.  Sensory examination is intact to pinprick, light touch and position sense testing.  Reflexes are 2+ and symmetrical. Plantars are down going. Cerebellar examination reveals no gross ataxia or dysmetria. Gait is normal and the patient can tandem walk without any difficulty.       Final result (Exam End: 09/29/2016 ??9:36 AM) Open    Study Result     CT scan of head, without contrast:  ??  ??  ??  HISTORY:[  ??  New onset of weakness in bilateral extremities since this morning.  ??  History of myocardial infarction.  ??  TECHNIQUE:  ??  Contiguous 5 mm thick axial sections of brain have been obtained without  intravenous contrast.  ??  ??  [2-D coronal and sagittal images reformatted.]   ??  The study has been performed utilizing appropriate radiation dose reduction  technique according to specification of the scanner, with modification of MAA/KV  and appropriate collimation adjusted to patient's body habitus.  ??  ????  COMPARISON STUDY: [None]  ??  ??  -------------------------------------  ??  ??  FINDINGS:  ??  ??  ??  Intracranial hemorrhage :[None.]  ??  Intracranial mass lesion:[ None.]  ??  Midline shift: [None.]  ??  Cerebral cortical atrophy:[ None].  ??  Cerebral central atrophy:[ None.]  ??  Chronic microvascular ischemic changes/aging changes in white matter:[ None.]  ??  Acute cortical infarction:[ None.]  ??  Old cerebral infarction: [None.]  ??  Basal ganglia structures of both sides:[ No diagnostic finding.]  ??  Brainstem:[ No diagnostic  finding]  ??  Cerebellum: No diagnostic finding.  ??  Calvarium and  base of skull:[ No fracture or focal lesion].  ??  In visualized portions of both orbits:[ No diagnostic finding.]  ??  In visualized portions of paranasal sinuses:[ No diagnostic finding.]  ??  In visualized base of l skull:[ No diagnostic finding.]  ??  ??  IMPRESSION  IMPRESSION:  ??  No evidence of intracranial hemorrhages or any other definable acute  intracranial process. No definable focal abnormality in brain.  ??  The findings have been reported by me to ER physician Dr. Marilynn Latino , at [phone]  hours on 09/29/2016.         I have reviewed the above imagines myself.       CBC:   Lab Results   Component Value Date/Time    WBC 10.9 09/29/2016 09:50 AM    RBC 5.34 09/29/2016 09:50 AM    HGB 16.7 09/29/2016 09:50 AM    HCT 48.2 09/29/2016 09:50 AM    PLATELET 314 09/29/2016 09:50 AM     BMP:   Lab Results   Component Value Date/Time    Glucose 121 09/29/2016 09:50 AM    Sodium 136 09/29/2016 09:50 AM    Potassium 3.4 09/29/2016 09:50 AM    Chloride 104 09/29/2016 09:50 AM    CO2 27 09/29/2016 09:50 AM    BUN 10 09/29/2016 09:50 AM    Creatinine 0.96 09/29/2016 09:50 AM    Calcium 8.5 09/29/2016 09:50 AM     CMP:   Lab Results   Component Value Date/Time    Glucose 121 09/29/2016 09:50 AM    Sodium 136 09/29/2016 09:50 AM    Potassium 3.4 09/29/2016 09:50 AM    Chloride 104 09/29/2016 09:50 AM    CO2 27 09/29/2016 09:50 AM    BUN 10 09/29/2016 09:50 AM    Creatinine 0.96 09/29/2016 09:50 AM    Calcium 8.5 09/29/2016 09:50 AM    Anion gap 5 09/29/2016 09:50 AM    BUN/Creatinine ratio 10 09/29/2016 09:50 AM    Alk. phosphatase 69 09/29/2016 09:50 AM    Protein, total 8.0 09/29/2016 09:50 AM    Albumin 3.9 09/29/2016 09:50 AM    Globulin 4.1 09/29/2016 09:50 AM    A-G Ratio 1.0 09/29/2016 09:50 AM     Coagulation:   Lab Results   Component Value Date/Time    Prothrombin time 12.0 09/29/2016 09:50 AM    INR 0.9 09/29/2016 09:50 AM    aPTT 29.2 09/29/2016 09:50 AM     Cardiac markers: No results found for: CPK, CKND1,  MYO       Impression: Young man with established vascular disease who was admitted with a left hemiparesis but was intermittent in its nature possible TIA versus seizure in this man who has risk factors including prior heart attack continued tobacco abuse and hypertension.  He also is medically noncompliant.    Plan: Need to see what his MRI scan of the brain shows.  Also needs an EEG to see if this.  Admits to stroke for close observation.    PLEASE NOTE:   This document has been produced using voice recognition software. Unrecognized errors in transcription may be present.

## 2016-09-30 LAB — ECHOCARDIOGRAM COMPLETE 2D W DOPPLER W COLOR: Left Ventricular Ejection Fraction: 50

## 2016-09-30 LAB — CBC WITH AUTOMATED DIFF
ABS. BASOPHILS: 0 10*3/uL (ref 0.0–0.06)
ABS. EOSINOPHILS: 0.2 10*3/uL (ref 0.0–0.4)
ABS. LYMPHOCYTES: 3.4 10*3/uL (ref 0.9–3.6)
ABS. MONOCYTES: 0.7 10*3/uL (ref 0.05–1.2)
ABS. NEUTROPHILS: 4.1 10*3/uL (ref 1.8–8.0)
BASOPHILS: 1 % (ref 0–2)
EOSINOPHILS: 3 % (ref 0–5)
HCT: 44.5 % (ref 36.0–48.0)
HGB: 14.8 g/dL (ref 13.0–16.0)
LYMPHOCYTES: 40 % (ref 21–52)
MCH: 30.5 PG (ref 24.0–34.0)
MCHC: 33.3 g/dL (ref 31.0–37.0)
MCV: 91.6 FL (ref 74.0–97.0)
MONOCYTES: 9 % (ref 3–10)
MPV: 10 FL (ref 9.2–11.8)
NEUTROPHILS: 47 % (ref 40–73)
PLATELET: 269 10*3/uL (ref 135–420)
RBC: 4.86 M/uL (ref 4.70–5.50)
RDW: 13.6 % (ref 11.6–14.5)
WBC: 8.4 10*3/uL (ref 4.6–13.2)

## 2016-09-30 LAB — METABOLIC PANEL, BASIC
Anion gap: 3 mmol/L (ref 3.0–18)
BUN/Creatinine ratio: 13 (ref 12–20)
BUN: 12 MG/DL (ref 7.0–18)
CO2: 29 mmol/L (ref 21–32)
Calcium: 8.3 MG/DL — ABNORMAL LOW (ref 8.5–10.1)
Chloride: 106 mmol/L (ref 100–108)
Creatinine: 0.96 MG/DL (ref 0.6–1.3)
GFR est AA: >60 mL/min/{1.73_m2} (ref >60)
GFR est non-AA: >60 mL/min/{1.73_m2} (ref >60)
Glucose: 96 mg/dL (ref 74–99)
Potassium: 4.2 mmol/L (ref 3.5–5.5)
Sodium: 138 mmol/L (ref 136–145)

## 2016-09-30 LAB — LIPID PANEL
CHOL/HDL Ratio: 3.7 (ref 0–5.0)
Cholesterol, total: 204 MG/DL — ABNORMAL HIGH (ref <200)
HDL Cholesterol: 55 MG/DL (ref 40–60)
LDL, calculated: 121 MG/DL — ABNORMAL HIGH (ref 0–100)
Triglyceride: 140 MG/DL (ref <150)
VLDL, calculated: 28 MG/DL

## 2016-09-30 LAB — GLUCOSE, POC: Glucose (POC): 113 mg/dL — ABNORMAL HIGH (ref 70–110)

## 2016-09-30 LAB — HEMOGLOBIN A1C WITH EAG
Est. average glucose: 100 mg/dL
Hemoglobin A1c: 5.1 % (ref 4.2–5.6)

## 2016-09-30 MED ORDER — LISINOPRIL 10 MG TAB
10 mg | Freq: Every day | ORAL | Status: DC
Start: 2016-09-30 — End: 2016-09-30
  Administered 2016-09-30: 18:00:00 via ORAL

## 2016-09-30 MED ORDER — LISINOPRIL 10 MG TAB
10 mg | ORAL_TABLET | Freq: Every day | ORAL | 1 refills | Status: AC
Start: 2016-09-30 — End: ?

## 2016-09-30 MED ORDER — NICOTINE 21 MG/24 HR DAILY PATCH
21 mg/24 hr | MEDICATED_PATCH | Freq: Every day | TRANSDERMAL | 1 refills | Status: AC
Start: 2016-09-30 — End: 2016-10-30

## 2016-09-30 MED ORDER — ATORVASTATIN 40 MG TAB
40 mg | ORAL_TABLET | Freq: Every evening | ORAL | 1 refills | Status: DC
Start: 2016-09-30 — End: 2016-09-30

## 2016-09-30 MED ORDER — TRAMADOL 50 MG TAB
50 mg | Freq: Four times a day (QID) | ORAL | Status: DC | PRN
Start: 2016-09-30 — End: 2016-09-30
  Administered 2016-09-30 (×3): via ORAL

## 2016-09-30 MED ORDER — ATORVASTATIN 40 MG TAB
40 mg | ORAL_TABLET | Freq: Every evening | ORAL | 1 refills | Status: AC
Start: 2016-09-30 — End: ?

## 2016-09-30 MED ORDER — ASPIRIN 325 MG TAB
325 mg | ORAL_TABLET | Freq: Every day | ORAL | 1 refills | Status: AC
Start: 2016-09-30 — End: ?

## 2016-09-30 MED FILL — TRAMADOL 50 MG TAB: 50 mg | ORAL | Qty: 1

## 2016-09-30 MED FILL — HEPARIN (PORCINE) 5,000 UNIT/ML IJ SOLN: 5000 unit/mL | INTRAMUSCULAR | Qty: 1

## 2016-09-30 MED FILL — NICOTINE 21 MG/24 HR DAILY PATCH: 21 mg/24 hr | TRANSDERMAL | Qty: 1

## 2016-09-30 MED FILL — ASPIRIN 325 MG TAB: 325 mg | ORAL | Qty: 1

## 2016-09-30 MED FILL — ATORVASTATIN 40 MG TAB: 40 mg | ORAL | Qty: 2

## 2016-09-30 MED FILL — LISINOPRIL 10 MG TAB: 10 mg | ORAL | Qty: 1

## 2016-09-30 NOTE — Discharge Summary (Signed)
Discharge summary dictated.  ID#: [id number].

## 2016-09-30 NOTE — Other (Signed)
Bedside and Verbal shift change report given to ALICE RN (oncoming nurse) by Lori George RN (offgoing nurse).  Report given with SBAR, Kardex, Intake/Output, MAR, Accordion and Recent Results.

## 2016-09-30 NOTE — Progress Notes (Signed)
Correct made to Lipitor dose: 40mg  po QHS. Called pt, who will destroy incorrect Rx. Called his pharmacy, [facility] on [address], and called in correct Rx.  Dillon BjorkPeter D Toryn Mcclinton, MD  09/30/2016  4:49 PM

## 2016-09-30 NOTE — Progress Notes (Signed)
Problem: Self Care Deficits Care Plan (Adult)  Goal: *Acute Goals and Plan of Care (Insert Text)  Outcome: Resolved/Met Date Met:  09/30/16  OCCUPATIONAL THERAPY EVALUATION/DISCHARGE     Patient: [name] Bishop (35 y.o. male)  Date: 09/30/2016  Primary Diagnosis: TIA (transient ischemic attack)        Precautions:   Fall      ASSESSMENT AND RECOMMENDATIONS:  Based on the objective data described below, the patient is independent with functional mobility and self care tasks without an AD. Patient has WFL AROM and strength of BUEs. Patient is able to simulate LE self care task sitting on EOB independently. Patient was independent with simulated toilet transfer.   Skilled acute care occupational therapy is not indicated at this time.  Discharge Recommendations: None  Further Equipment Recommendations for Discharge: N/A       Barriers to Learning/Limitations: None      COMPLEXITY      Eval Complexity: History: LOW Complexity : Brief history review ; Examination: LOW Complexity : 1-3 performance deficits relating to physical, cognitive , or psychosocial skils that result in activity limitations and / or participation restrictions ; Decision Making:LOW Complexity : No comorbidities that affect functional and no verbal or physical assistance needed to complete eval tasks  Assessment: Low Complexity          G-CODES:      Self Care G8987 Current  CH= 0%  G8988 Goal  CH= 0%  G8989 D/C  CH= 0%.  The severity rating is based on the Level of Assistance required for Functional Mobility and ADLs.      SUBJECTIVE:   Patient stated ???I feel fine now.???      OBJECTIVE DATA SUMMARY:       Past Medical History:   Diagnosis Date   ??? Myocardial infarct     History reviewed. No pertinent surgical history.  Prior Level of Function/Home Situation: Patient reported he was independent in basic self care tasks and functional mobility PTA.  Home Situation  Home Environment: Private residence  # Steps to Enter: 2   One/Two Story Residence: One story  Living Alone: No  Support Systems: Copy  Patient Expects to be Discharged to:: Private residence  Current DME Used/Available at Home: None  [X]     Right hand dominant       [ ]     Left hand dominant  Cognitive/Behavioral Status:  Neurologic State: Alert  Orientation Level: Oriented X4  Cognition: Follows commands     Skin: No skin changes noted     Edema: No edema noted      Vision/Perceptual:       Acuity: Within Defined Limits       Coordination:  Coordination: Within functional limits (BUEs)       Balance:  Sitting: Intact  Standing: Intact;Without support     Strength:  Strength: Within functional limits (BUEs 5/5)     Tone & Sensation:  Tone: Normal (BUEs)  Sensation: Intact (BUEs)     Range of Motion:  AROM: Within functional limits (BUEs)     Functional Mobility and Transfers for ADLs:  Bed Mobility:  Rolling: Independent  Supine to Sit: Independent  Sit to Supine: Independent  Scooting: Independent  Transfers:  Sit to Stand: Independent              Toilet Transfer : Independent (simulated)  ADL Assessment:(clinical judgement)  Feeding: Independent     Oral Facial Hygiene/Grooming: Independent     Bathing: Independent     Upper Body Dressing: Independent     Lower Body Dressing: Independent     Toileting: Independent      Pain:  Pt reports 0/10 pain or discomfort prior to treatment.    Pt reports 0/10 pain or discomfort post treatment.      Activity Tolerance:   Good     Please refer to the flowsheet for vital signs taken during this treatment.  After treatment:   [ ]  Patient left in no apparent distress sitting up in chair  [X]  Patient left in no apparent distress in bed  [X]  Call bell left within reach  [X]  Nursing notified  [ ]  Caregiver present  [ ]  Bed alarm activated      COMMUNICATION/EDUCATION:   Communication/Collaboration:  [ ]      Home safety education was provided and the patient/caregiver  indicated understanding.  [X]      Patient/family have participated as able and agree with findings and recommendations.  [ ]      Patient is unable to participate in plan of care at this time.     Mathews Argyle, OTR/L  Time Calculation: 8 mins

## 2016-09-30 NOTE — Progress Notes (Signed)
Problem: Patient Education: Go to Patient Education Activity  Goal: Patient/Family Education  Stroke Education provided to patient and the following topics were discussed     1. Patients personal risk factors for stroke are hypertension, smoking and hyperlipidemia     2. Warning signs of Stroke:         * Sudden numbness or weakness of the face, arm or leg, especially on one side of          The body            * Sudden confusion, trouble speaking or understanding         * Sudden trouble seeing in one or both eyes         * Sudden trouble walking, dizziness, loss of balance or coordination         * Sudden severe headache with no known cause        3. Importance of activation Emergency Medical Services ( 9-1-1 ) immediately if experience any warning signs of stroke.     4. Be sure and schedule a follow-up appointment with your primary care doctor or any specialists as instructed.      5. You must take medicine every day to treat your risk factors for stroke.  Be sure to take your medicines exactly as your doctor tells you: no more, no less.  Know what your medicines are for , what they do.  Anti-thrombotics /anticoagulants can help prevent strokes.  You are taking the following medicine(s)  see mar        6.  Smoking and second-hand smoke greatly increase your risk of stroke, cardiovascular disease and death. Smoking history packs, 1 per day     7. Information provided was [facility] Stroke Education Binder     8. Documentation of teaching completed in Patient Education Activity and on Care Plan with teaching response noted?  yes

## 2016-09-30 NOTE — Discharge Summary (Signed)
[facility]  DISCHARGE SUMMARY    Name:  [name] Bishop ItoHARRELL, [name] Bishop  MR#:  [id number]  DOB:  1981-01-03  Account #:  [id number]  Date of Adm:  09/29/2016  Date of Discharge:  09/30/2016      DISCHARGE DIAGNOSES  1. Acute cerebrovascular accident.  2. Tobacco abuse.  3. History of myocardial infarction.    SERVICE: The patient was admitted to the hospitalist service.    CONSULTS: Dr. Lethea KillingsGebel was consulted for Neurology.    PROCEDURES: None.    RELEVANT STUDIES: Included MRI of the brain which came back  showing small acute infarct in the right posterior centrum semiovale.    HISTORY OF PRESENT ILLNESS: Please refer to my admission H  and P.    Briefly, the patient is a 35 year old gentleman with a past history  significant for MI when he was in his 1920s x2, who came to the  emergency department with sudden onset of left arm weakness. On  the morning of presentation while doing yard work he felt like all said  he could not move the upper part of his body. This lasted momentarily,  but he had continued difficulty with weakness and discoordination  involving his left arm, so he came to the emergency department for  further evaluation.    He had a CT of his head performed here, this was negative for acute.    He takes no medications. He has not been following up with any  primary care physicians recently.    When I saw him, vital signs were otherwise stable and normal with a  pressure 164/99.    He appeared to be in no distress, had a regular heart, clear lungs,  benign abdomen. No calf tenderness or edema. He had 5/5 motor  strength x4 with. Cranial nerves 2 through 12 were grossly intact.    LABORATORY DATA: Metabolic panel was normal, CBC normal, lipid  panel showed total cholesterol of 204, HDL 55, LDL 121 and a  triglyceride level of 140.    HOSPITAL COURSE BY PROBLEM  1. Acute cerebrovascular accident: Maintained on stroke protocol. MRI  findings are as above. Continue aspirin, statin therapy. He had a   repeat echocardiogram performed, this showed ejection fraction of  50% and grade 1 diastolic dysfunction. Case was discussed with  Neurology. Recommend outpatient followup. He did have some  elevated pressures are well. We started him on some lisinopril.  2. Hypertension: Elevated pressures while here, started on lisinopril.  Outpatient followup.  3. Hyperlipidemia on statin therapy.  4. Tobacco abuse. Strongly counseled on cessation. Nicotine patch  has been written for this patient.  5. History of myocardial infarction: By history. Outpatient followup. He  takes no medications for this. We started him on some aspirin on an  outpatient basis. He should follow up, and have better control of his  pressures.    On examination today, vital signs are stable and normal with last  pressure 151/96. The patient denied any fevers, chills, nausea,  vomiting, chest pain, shortness of breath, palpitations or edema. He  ambulates without difficulty. No weakness. No discoordination. No  difficulty swallowing. Family present at bedside. The patient appears to  be at baseline with no facial asymmetry.    He had a regular heart, clear lungs, benign abdomen. No calf  tenderness or edema.    DISPOSITION: Home.    MEDICATIONS ON DISCHARGE  NEW MEDICINE AS FOLLOWS  1. Aspirin 325 mg p.o. daily.  2. Lipitor 40 mg p.o. every p.m.  3. Lisinopril 10 mg p.o. daily.  4. Nicotine patch 21 mg to skin daily, remove old patch.    FOLLOWUP: With his PCP in 7-10 days. Neurology in 4-6 weeks.    Total time of discharge greater than 30 minutes.        Dillon Bjork, MD    PP / DC  D:  09/30/2016   16:45  T:  09/30/2016   20:57  Job #:  [id number]

## 2016-09-30 NOTE — Progress Notes (Signed)
Re:  [name], Bishop up visit     09/30/2016 1:38 PM    SSN: [id number]    Subjective:   [name] Bishop is seen in follow up for his neurologic syndrome.  He has no complaints.    Medications:    Current Facility-Administered Medications   Medication Dose Route Frequency Provider Last Rate Last Dose   ??? lisinopril (PRINIVIL, ZESTRIL) tablet 10 mg  10 mg Oral DAILY Eden Emms, MD       ??? atorvastatin (LIPITOR) tablet 80 mg  80 mg Oral QHS Marilynn Latino, MD   80 mg at 09/29/16 2123   ??? sodium chloride (NS) flush 5-10 mL  5-10 mL IntraVENous Q8H Eden Emms, MD   10 mL at 09/29/16 2127   ??? sodium chloride (NS) flush 5-10 mL  5-10 mL IntraVENous PRN Eden Emms, MD       ??? aspirin (ASPIRIN) tablet 325 mg  325 mg Oral DAILY Eden Emms, MD   325 mg at 09/30/16 2876   ??? heparin (porcine) injection 5,000 Units  5,000 Units SubCUTAneous Q8H Eden Emms, MD       ??? nicotine (NICODERM CQ) 21 mg/24 hr patch 1 Patch  1 Patch TransDERmal DAILY Eden Emms, MD   1 Patch at 09/30/16 [phone]   ??? traMADol (ULTRAM) tablet 50 mg  50 mg Oral Q6H PRN Joaquin Music, MD   50 mg at 09/30/16 0744       Vital signs:    Visit Vitals   ??? BP (!) 151/96 (BP 1 Location: Right arm, BP Patient Position: At rest)   ??? Pulse 68   ??? Temp 98.3 ??F (36.8 ??C)   ??? Resp 19   ??? Ht 5' 10.5" (1.791 m)   ??? Wt 93 kg (205 lb)   ??? SpO2 98%   ??? BMI 29 kg/m2       Review of Systems:   As above otherwise 11 point review of systems negative including;   Constitutional no fever or chills  Skin denies rash or itching  HEENT  Denies tinnitus, hearing lose  Eyes denies diplopia vision lose  Respiratory denies sortness of breath  Cardiovascular denies chest pain, dyspnea on exertion  Gastrointestinal denies nausea, vomiting, diarrhea, constipation  Genitourinary denies incontinence  Musculoskeletal denies joint pain or swelling  Endocrine denies weight change  Hematology denies easy bruising or bleeding   Neurological as above in HPI       Patient Active Problem List   Diagnosis Code   ??? Myocardial infarct I21.9   ??? TIA (transient ischemic attack) G45.9         Objective: The patient is awake, alert, and oriented x 4.  Fund of knowledge is adequate.  Speech is fluent and memory is intact.  Cranial Nerves: II ??? Visual fields are full to confrontation.  III, IV, VI ??? Extraocular movements are intact. There is no nystagmus. V ??? Facial sensation is intact to pinprick.  VII ??? Face is symmetrical.  VIII - Hearing is present.  IX, X, XII ??? Palate is symmetrical.   XI - Shoulder shrugging and head turning intact  Motor:  The patient moves all four limbs fairly well and symmetrically. Tone is normal. Reflexes are 2+ and symmetrical. Plantars are down going. Gait is normal.    Final result (Exam End: 09/29/2016 ??8:04 PM) Open    Study Result   EXAMINATION: MRI brain without contrast  ??  INDICATION:  Transient ischemic attack, left arm weakness  ??  COMPARISON: CT same date  ??  TECHNIQUE: Multiplanar multiecho MRI sequences of the brain performed without  contrast.  ??  FINDINGS:  ??  Small focus of restricted diffusion in the right posterior centrum semiovale. No  evidence of abnormal gradient signal to suggest acute hemorrhage. No focal mass  effect or shift of midline structures. Sella and pineal regions unremarkable. No  abnormal extra-axial collection identified. Ventricles are normal in size and  configuration. Major intracranial flow voids are preserved. Major intracranial  flow voids are preserved. No suspicious calvarial lesions. Imaged paranasal  sinuses and mastoid air cells are well-aerated. Extracranial soft tissues  unremarkable.  ??  IMPRESSION  IMPRESSION:  ??  1. Small acute infarct in the right posterior centrum semiovale.         I have reviewed the above imagines myself.    CBC:   Lab Results   Component Value Date/Time    WBC 8.4 09/30/2016 03:15 AM    RBC 4.86 09/30/2016 03:15 AM    HGB 14.8 09/30/2016 03:15 AM    HCT 44.5 09/30/2016 03:15 AM     PLATELET 269 09/30/2016 03:15 AM     BMP:   Lab Results   Component Value Date/Time    Glucose 96 09/30/2016 03:15 AM    Sodium 138 09/30/2016 03:15 AM    Potassium 4.2 09/30/2016 03:15 AM    Chloride 106 09/30/2016 03:15 AM    CO2 29 09/30/2016 03:15 AM    BUN 12 09/30/2016 03:15 AM    Creatinine 0.96 09/30/2016 03:15 AM    Calcium 8.3 09/30/2016 03:15 AM     CMP:   Lab Results   Component Value Date/Time    Glucose 96 09/30/2016 03:15 AM    Sodium 138 09/30/2016 03:15 AM    Potassium 4.2 09/30/2016 03:15 AM    Chloride 106 09/30/2016 03:15 AM    CO2 29 09/30/2016 03:15 AM    BUN 12 09/30/2016 03:15 AM    Creatinine 0.96 09/30/2016 03:15 AM    Calcium 8.3 09/30/2016 03:15 AM    Anion gap 3 09/30/2016 03:15 AM    BUN/Creatinine ratio 13 09/30/2016 03:15 AM    Alk. phosphatase 69 09/29/2016 09:50 AM    Protein, total 8.0 09/29/2016 09:50 AM    Albumin 3.9 09/29/2016 09:50 AM    Globulin 4.1 09/29/2016 09:50 AM    A-G Ratio 1.0 09/29/2016 09:50 AM     Coagulation:   Lab Results   Component Value Date/Time    Prothrombin time 12.0 09/29/2016 09:50 AM    INR 0.9 09/29/2016 09:50 AM    aPTT 29.2 09/29/2016 09:50 AM     Cardiac markers: No results found for: CPK, CKND1, MYO    09/29/2016 10:51 AM - Edi, Lab In [facility]   Component Results   Component Value Flag Ref Range Units Status   LIPID PROFILE ????    ?? Final   Cholesterol, total 240 (H) <200 MG/DL Final   Triglyceride 260 (H) <150 MG/DL Final   Comment:   The drugs N-acetylcysteine (NAC) and   Metamiszole have been found to cause falsely   low results in this chemical assay. Please   be sure to submit blood samples obtained   BEFORE administration of either of these   drugs to assure correct results.      HDL Cholesterol 66 (H) 40 - 60 MG/DL Final   LDL, calculated 122 (H) 0 -  100 MG/DL Final   VLDL, calculated 52   MG/DL Final   CHOL/HDL Ratio 3.6  0 - 5.0 ??      09/29/2016 11:04 AM - Edi, Lab In [facility]   Component Results    Component Value Flag Ref Range Units Status   Hemoglobin A1c 5.0  4.2 - 5.6 % Final         Assessment:  New small stroke in this man who has risk factors including prior coronary disease, hypertension uncontrolled, tobacco abuse.    Plan:  OK for discharge from my standpoint.  Must stop smoking.  Statin and aspirin as well as hypertensive therapy.      Sincerely,        Legrand Como A. Gretchen Portela, M.D.

## 2016-09-30 NOTE — Other (Signed)
Pt and family given discharge instructions and taken to the lobby via wheelchair.

## 2016-09-30 NOTE — Discharge Summary (Signed)
Discharge summary dictated.  ID#: [id number]: [id number].

## 2016-09-30 NOTE — Discharge Summary (Signed)
[facility]  DISCHARGE SUMMARY    Name:  [name] Bishop, [name] Bishop  MR#:  [id number]  DOB:  Jun 14, 1981  Account #:  [id number]  Date of Adm:  09/29/2016  Date of Discharge:  09/30/2016      DISCHARGE DIAGNOSES  1. Acute cerebrovascular accident.  2. Tobacco abuse.  3. History of myocardial infarction.    SERVICE: The patient was admitted to the hospitalist service.    CONSULTS: Dr. Lethea KillingsGebel was consulted for Neurology.    PROCEDURES: None.    RELEVANT STUDIES: Included MRI of the brain which came back  showing small acute infarct in the right posterior centrum semiovale.    HISTORY OF PRESENT ILLNESS: Please refer to my admission H  and P.    Briefly, the patient is a 35 year old gentleman with a past history  significant for MI when he was in his 5420s x2, who came to the  emergency department with sudden onset of left arm weakness. On  the morning of presentation while doing yard work he felt like all said  he could not move the upper part of his body. This lasted momentarily,  but he had continued difficulty with weakness and discoordination  involving his left arm, so he came to the emergency department for  further evaluation.    He had a CT of his head performed here, this was negative for acute.    He takes no medications. He has not been following up with any  primary care physicians recently.    When I saw him, vital signs were otherwise stable and normal with a  pressure 164/99.    He appeared to be in no distress, had a regular heart, clear lungs,  benign abdomen. No calf tenderness or edema. He had 5/5 motor  strength x4 with. Cranial nerves 2 through 12 were grossly intact.    LABORATORY DATA: Metabolic panel was normal, CBC normal, lipid  panel showed total cholesterol of 204, HDL 55, LDL 121 and a  triglyceride level of 140.    HOSPITAL COURSE BY PROBLEM  1. Acute cerebrovascular accident: Maintained on stroke protocol. MRI  findings are as above. Continue aspirin, statin therapy. He had  a  repeat echocardiogram performed, this showed ejection fraction of  50% and grade 1 diastolic dysfunction. Case was discussed with  Neurology. Recommend outpatient followup. He did have some  elevated pressures are well. We started him on some lisinopril.  2. Hypertension: Elevated pressures while here, started on lisinopril.  Outpatient followup.  3. Hyperlipidemia on statin therapy.  4. Tobacco abuse. Strongly counseled on cessation. Nicotine patch  has been written for this patient.  5. History of myocardial infarction: By history. Outpatient followup. He  takes no medications for this. We started him on some aspirin on an  outpatient basis. He should follow up, and have better control of his  pressures.    On examination today, vital signs are stable and normal with last  pressure 151/96. The patient denied any fevers, chills, nausea,  vomiting, chest pain, shortness of breath, palpitations or edema. He  ambulates without difficulty. No weakness. No discoordination. No  difficulty swallowing. Family present at bedside. The patient appears to  be at baseline with no facial asymmetry.    He had a regular heart, clear lungs, benign abdomen. No calf  tenderness or edema.    DISPOSITION: Home.    MEDICATIONS ON DISCHARGE  NEW MEDICINE AS FOLLOWS  1. Aspirin 325 mg p.o. daily.  2. Lipitor 40 mg p.o. every p.m.  3. Lisinopril 10 mg p.o. daily.  4. Nicotine patch 21 mg to skin daily, remove old patch.    FOLLOWUP: With his PCP in 7-10 days. Neurology in 4-6 weeks.    Total time of discharge greater than 30 minutes.        Dillon Bjork, MD    PP / DC  D:  09/30/2016   16:45  T:  09/30/2016   20:57  Job #:  [id number]

## 2016-11-17 ENCOUNTER — Encounter: Attending: Neurology | Primary: Family Medicine

## 2021-04-13 ENCOUNTER — Ambulatory Visit
Admission: EM | Admit: 2021-04-13 | Discharge: 2021-04-13 | Disposition: A | Discharge status: Home / Self Care | Payer: Self-pay | Attending: Sports Medicine | Admitting: Sports Medicine

## 2021-04-13 ENCOUNTER — Ambulatory Visit: Payer: Self-pay

## 2021-04-13 ENCOUNTER — Ambulatory Visit (INDEPENDENT_AMBULATORY_CARE_PROVIDER_SITE_OTHER): Payer: Self-pay

## 2021-04-13 ENCOUNTER — Other Ambulatory Visit: Payer: Self-pay

## 2021-04-13 ENCOUNTER — Encounter: Payer: Self-pay | Admitting: Emergency Medicine

## 2021-04-13 DIAGNOSIS — M25531 Pain in right wrist: Secondary | ICD-10-CM

## 2021-04-13 DIAGNOSIS — S6991XA Unspecified injury of right wrist, hand and finger(s), initial encounter: Secondary | ICD-10-CM

## 2021-04-13 DIAGNOSIS — S63501A Unspecified sprain of right wrist, initial encounter: Secondary | ICD-10-CM

## 2021-04-13 DIAGNOSIS — M25631 Stiffness of right wrist, not elsewhere classified: Secondary | ICD-10-CM

## 2021-04-13 MED ORDER — DICLOFENAC SODIUM 75 MG PO TBEC: 75.0000 mg | DELAYED_RELEASE_TABLET | Freq: Two times a day (BID) | ORAL | 0 refills | Status: DC

## 2021-04-13 NOTE — ED Provider Notes (Signed)
[facility]    CSN: [id number] Arrival date & time: 04/13/21  1000      History   Chief Complaint Chief Complaint  Patient presents with  . Wrist Pain    right    HPI Francis Roberts is a 40 y.o. male.   Pleasant 40 year old right-hand-dominant male who presents for evaluation of the above issue.  He has no primary care physician.  He was moving a bed yesterday and it dropped on his hand and wrist.  He has abrasions at the third fourth and fifth MCP.  He is right-hand dominant.  He thought that it would be fine but he went to work this morning and was having difficulty doing his job.  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  He called his Health and safety inspector and came into the urgent care for evaluation.  He denies any fever shakes chills.  No nausea vomiting or diarrhea.  No chronic problems with this wrist.  He denies any neck pain, shoulder pain, elbow pain, no numbness or tingling or any radicular symptoms.  No red flag signs or symptoms elicited on history.     History reviewed. No pertinent past medical history.  There are no problems to display for this patient.   History reviewed. No pertinent surgical history.     Home Medications    Prior to Admission medications   Medication Sig Start Date End Date Taking? Authorizing Provider  diclofenac (VOLTAREN) 75 MG EC tablet Take 1 tablet (75 mg total) by mouth 2 (two) times daily. 04/13/21  Yes Verda Cumins, MD  albuterol (PROVENTIL HFA;VENTOLIN HFA) 108 (90 BASE) MCG/ACT inhaler Inhale 2 puffs into the lungs every 6 (six) hours as needed for wheezing or shortness of breath. Patient not taking: No sig reported 10/31/15   Eula Listen, MD  cyclobenzaprine (FLEXERIL) 10 MG tablet Take 1 tablet (10 mg total) by mouth 3 (three) times daily as needed for muscle spasms. Patient not taking: No sig reported 07/01/16   Beers, Pierce Crane, PA-C  HYDROcodone-acetaminophen (NORCO)  5-325 MG tablet Take 1-2 tablets by mouth every 4 (four) hours as needed for moderate pain. Patient not taking: No sig reported 07/01/16   Arlyss Repress, PA-C    Family History History reviewed. No pertinent family history.  Social History Social History   Tobacco Use  . Smoking status: Current Every Day Smoker    Packs/day: 0.50    Types: Cigarettes  . Smokeless tobacco: Never Used  Vaping Use  . Vaping Use: Never used  Substance Use Topics  . Alcohol use: No  . Drug use: No     Allergies   Patient has no known allergies.   Review of Systems Review of Systems  Constitutional: Positive for activity change. Negative for chills, diaphoresis and fever.  HENT: Negative.   Eyes: Negative.   Respiratory: Negative.   Cardiovascular: Negative.   Gastrointestinal: Negative.   Genitourinary: Negative.   Musculoskeletal: Positive for arthralgias. Negative for back pain, myalgias, neck pain and neck stiffness.  Skin: Positive for wound. Negative for color change, pallor and rash.  Neurological: Negative.  Negative for dizziness, tremors, syncope, weakness and numbness.  All other systems reviewed and are negative.    Physical Exam Triage Vital Signs ED Triage Vitals  Enc Vitals Group     BP 04/13/21 1012 125/80     Pulse Rate 04/13/21 1012 65     Resp 04/13/21 1012  16     Temp 04/13/21 1012 98.6 F (37 C)     Temp Source 04/13/21 1012 Oral     SpO2 04/13/21 1012 100 %     Weight 04/13/21 1010 170 lb (77.1 kg)     Height 04/13/21 1010 5\' 9"  (1.753 m)     Head Circumference --      Peak Flow --      Pain Score 04/13/21 1009 9     Pain Loc --      Pain Edu? --      Excl. in [place]? --    No data found.  Updated Vital Signs BP 125/80 (BP Location: Left Arm)   Pulse 65   Temp 98.6 F (37 C) (Oral)   Resp 16   Ht 5\' 9"  (1.753 m)   Wt 77.1 kg   SpO2 100%   BMI 25.10 kg/m   Visual Acuity Right Eye Distance:   Left Eye Distance:   Bilateral Distance:     Right Eye Near:   Left Eye Near:    Bilateral Near:     Physical Exam Vitals and nursing note reviewed.  Constitutional:      General: He is not in acute distress.    Appearance: Normal appearance. He is not ill-appearing, toxic-appearing or diaphoretic.  HENT:     Head: Normocephalic and atraumatic.  Eyes:     General: No scleral icterus.       Right eye: No discharge.        Left eye: No discharge.     Extraocular Movements: Extraocular movements intact.     Conjunctiva/sclera: Conjunctivae normal.     Pupils: Pupils are equal, round, and reactive to light.  Cardiovascular:     Rate and Rhythm: Normal rate and regular rhythm.     Pulses: Normal pulses.     Heart sounds: Normal heart sounds.  Pulmonary:     Effort: Pulmonary effort is normal.     Breath sounds: Normal breath sounds.  Musculoskeletal:     Right wrist: Tenderness and bony tenderness present. No swelling, deformity, effusion, lacerations, snuff box tenderness or crepitus. Decreased range of motion. Normal pulse.     Left wrist: Normal.     Cervical back: Normal range of motion and neck supple. No rigidity or tenderness.     Comments: Examination of the left hand and wrist is within normal limits. Examination of the right hand and wrist reveals abrasions at the third fourth and fifth MCP joints.  No active bleeding.  Patient does have decreased range of motion of the wrist in all planes.  There is no involvement of the elbow or the shoulder.  He is tender to palpation at the wrist joint and a little bit at the first MCP joint.  Grip strength is well-preserved.  There is no evidence of any tendon retraction.  Neurovascular: Normal sensation 2+ pulses distally.  Skin:    General: Skin is warm.     Capillary Refill: Capillary refill takes less than 2 seconds.     Findings: Lesion present. No bruising, erythema or rash.  Neurological:     General: No focal deficit present.     Mental Status: He is alert and  oriented to person, place, and time.      UC Treatments / Results  Labs (all labs ordered are listed, but only abnormal results are displayed) Labs Reviewed - No data to display  EKG   Radiology DG Wrist Complete Right  Result Date: 04/13/2021 CLINICAL DATA:  Acute RIGHT wrist pain following injury yesterday. Initial encounter. EXAM: RIGHT WRIST - COMPLETE 3+ VIEW COMPARISON:  None. FINDINGS: There is no evidence of fracture or dislocation. There is no evidence of arthropathy or other focal bone abnormality. Soft tissues are unremarkable. IMPRESSION: Negative. Electronically Signed   By: Margarette Canada M.D.   On: 04/13/2021 11:29    Procedures Procedures (including critical care time)  Medications Ordered in UC Medications - No data to display  Initial Impression / Assessment and Plan / UC Course  I have reviewed the triage vital signs and the nursing notes.  Pertinent labs & imaging results that were available during my care of the patient were reviewed by me and considered in my medical decision making (see chart for details).  Clinical impression: Right wrist sprain secondary to an injury.  Patient does have an abrasion over the third and fourth MCP joints as well as the fifth MCP joint.  Treatment plan: 1.  The findings and treatment plan were discussed in detail with the patient.  Patient was in agreement. 2.  We will get an x-ray of the right wrist.  My independent review of this x-ray shows no acute osseous findings.  Will await radiology overread. 3.  Ordered and dispensed a wrist brace. 4.  Put him on Voltaren 75 mg twice a day with food.  #30.  Cautioned him not to take any other anti-inflammatories including Advil, Motrin, ibuprofen, Naprosyn, Aleve or any aspirin products.  If he needs anything extra he can take Tylenol. 5.  Educational handouts provided. 6.  I did give him a work note keep him out a few days.  Once he gets the brace and the anti-inflammatories working  he should be able to return to work on Monday, May 2. 7.  Did give him a contact number for orthopedics if his symptoms or not improving he can contact them on his own.  I do not think that is going to be necessary though. 8.  Discharged from care in stable condition and he will follow-up here as needed.    Final Clinical Impressions(s) / UC Diagnoses   Final diagnoses:  Sprain of right wrist, initial encounter  Right wrist injury, initial encounter  Stiffness of right wrist joint     Discharge Instructions     As we discussed, your x-ray does not show fracture. I did give you a prescription strength anti-inflammatory.  Do not take any Motrin, Advil, ibuprofen, Naprosyn, Aleve, or any aspirin products with this.  You can take Tylenol only. I ordered and dispensed a wrist brace that you can use with activity.  He do not want a rely on it as your wrist will stiffen up. I have also provided you with the contact information for hand Ortho.  Call them if you are not improving. You want to work on range of motion, there is rehab exercises provided. Ice it several times a day. I also gave you a work note keeping her out of work for a few days until this calms down.    ED Prescriptions    Medication Sig Dispense Auth. Provider   diclofenac (VOLTAREN) 75 MG EC tablet Take 1 tablet (75 mg total) by mouth 2 (two) times daily. 30 tablet Verda Cumins, MD     PDMP not reviewed this encounter.   Verda Cumins, MD 04/14/21 [phone]

## 2021-04-13 NOTE — Discharge Instructions (Signed)
As we discussed, your x-ray does not show fracture. I did give you a prescription strength anti-inflammatory.  Do not take any Motrin, Advil, ibuprofen, Naprosyn, Aleve, or any aspirin products with this.  You can take Tylenol only. I ordered and dispensed a wrist brace that you can use with activity.  He do not want a rely on it as your wrist will stiffen up. I have also provided you with the contact information for hand Ortho.  Call them if you are not improving. You want to work on range of motion, there is rehab exercises provided. Ice it several times a day. I also gave you a work note keeping her out of work for a few days until this calms down.

## 2021-04-13 NOTE — ED Triage Notes (Signed)
Patient states that yesterday he was trying to get his phone out from under the bed and states that his family members were lifting the bed up and dropped the part of the bed on his right wrist.  Patient c/o pain in his right wrist.

## 2021-10-13 ENCOUNTER — Ambulatory Visit
Admission: EM | Admit: 2021-10-13 | Discharge: 2021-10-13 | Disposition: A | Discharge status: Home / Self Care | Payer: Self-pay | Attending: Physician Assistant | Admitting: Physician Assistant

## 2021-10-13 ENCOUNTER — Other Ambulatory Visit: Payer: Self-pay

## 2021-10-13 ENCOUNTER — Encounter: Payer: Self-pay | Admitting: Emergency Medicine

## 2021-10-13 DIAGNOSIS — R059 Cough, unspecified: Secondary | ICD-10-CM | POA: Insufficient documentation

## 2021-10-13 DIAGNOSIS — Z20822 Contact with and (suspected) exposure to covid-19: Secondary | ICD-10-CM | POA: Insufficient documentation

## 2021-10-13 DIAGNOSIS — J069 Acute upper respiratory infection, unspecified: Secondary | ICD-10-CM

## 2021-10-13 DIAGNOSIS — F17210 Nicotine dependence, cigarettes, uncomplicated: Secondary | ICD-10-CM | POA: Insufficient documentation

## 2021-10-13 DIAGNOSIS — R112 Nausea with vomiting, unspecified: Secondary | ICD-10-CM | POA: Insufficient documentation

## 2021-10-13 DIAGNOSIS — R197 Diarrhea, unspecified: Secondary | ICD-10-CM

## 2021-10-13 LAB — RAPID INFLUENZA A&B ANTIGENS
Influenza A (ARMC): NEGATIVE
Influenza B (ARMC): NEGATIVE

## 2021-10-13 MED ORDER — FLUTICASONE PROPIONATE 50 MCG/ACT NA SUSP: 2.0000 | Freq: Every day | NASAL | 0 refills | Status: DC

## 2021-10-13 MED ORDER — ONDANSETRON 4 MG PO TBDP: 4.0000 mg | ORAL_TABLET | Freq: Three times a day (TID) | ORAL | 0 refills | Status: DC | PRN

## 2021-10-13 MED ORDER — ALBUTEROL SULFATE HFA 108 (90 BASE) MCG/ACT IN AERS: 1.0000 | INHALATION_SPRAY | Freq: Four times a day (QID) | RESPIRATORY_TRACT | 0 refills | Status: DC | PRN

## 2021-10-13 MED ORDER — BENZONATATE 100 MG PO CAPS: 100.0000 mg | ORAL_CAPSULE | Freq: Three times a day (TID) | ORAL | 0 refills | Status: DC

## 2021-10-13 NOTE — ED Provider Notes (Signed)
[facility]    CSN: [id number] Arrival date & time: 10/13/21  7322      History   Chief Complaint Chief Complaint  Patient presents with   Cough   Emesis    HPI Francis Roberts is a 40 y.o. male.   HPI  Cough: Pt presents with dry cough, body aches, chills and a few episodes of vomiting per day without body emesis. He also reports a low grade fever. Symptoms started about 3 days ago. She has tried rest for symptoms. Partner is also sick with similar symptoms. He denies SOB, wheezing, abdominal pain. Requests work note.    History reviewed. No pertinent past medical history.  There are no problems to display for this patient.   History reviewed. No pertinent surgical history.   Home Medications    Prior to Admission medications   Medication Sig Start Date End Date Taking? Authorizing Provider  albuterol (PROVENTIL HFA;VENTOLIN HFA) 108 (90 BASE) MCG/ACT inhaler Inhale 2 puffs into the lungs every 6 (six) hours as needed for wheezing or shortness of breath. Patient not taking: No sig reported 10/31/15   Eula Listen, MD  cyclobenzaprine (FLEXERIL) 10 MG tablet Take 1 tablet (10 mg total) by mouth 3 (three) times daily as needed for muscle spasms. Patient not taking: No sig reported 07/01/16   Beers, Pierce Crane, PA-C  diclofenac (VOLTAREN) 75 MG EC tablet Take 1 tablet (75 mg total) by mouth 2 (two) times daily. 04/13/21   Verda Cumins, MD  HYDROcodone-acetaminophen (NORCO) 5-325 MG tablet Take 1-2 tablets by mouth every 4 (four) hours as needed for moderate pain. Patient not taking: No sig reported 07/01/16   Arlyss Repress, PA-C    Family History History reviewed. No pertinent family history.  Social History Social History   Tobacco Use   Smoking status: Every Day    Packs/day: 0.50    Types: Cigarettes   Smokeless tobacco: Never  Vaping Use   Vaping Use: Never used  Substance Use Topics   Alcohol use: No   Drug use: No      Allergies   Patient has no known allergies.   Review of Systems Review of Systems  As stated above in HPI Physical Exam Triage Vital Signs ED Triage Vitals  Enc Vitals Group     BP 10/13/21 0935 (!) 134/93     Pulse Rate 10/13/21 0935 79     Resp 10/13/21 0935 18     Temp 10/13/21 0935 98.3 F (36.8 C)     Temp Source 10/13/21 0935 Oral     SpO2 10/13/21 0935 100 %     Weight 10/13/21 0933 169 lb 15.6 oz (77.1 kg)     Height 10/13/21 0933 5\' 9"  (1.753 m)     Head Circumference --      Peak Flow --      Pain Score 10/13/21 0933 0     Pain Loc --      Pain Edu? --      Excl. in [place]? --    No data found.  Updated Vital Signs BP (!) 134/93 (BP Location: Left Arm)   Pulse 79   Temp 98.3 F (36.8 C) (Oral)   Resp 18   Ht 5\' 9"  (1.753 m)   Wt 169 lb 15.6 oz (77.1 kg)   SpO2 100%   BMI 25.10 kg/m   Physical Exam Vitals and nursing note reviewed.  Constitutional:      General: He is not  in acute distress.    Appearance: Normal appearance. He is not ill-appearing, toxic-appearing or diaphoretic.  HENT:     Head: Normocephalic and atraumatic.     Right Ear: Tympanic membrane normal.     Left Ear: Tympanic membrane normal.     Nose: Congestion and rhinorrhea present.     Mouth/Throat:     Mouth: Mucous membranes are moist.     Pharynx: Oropharynx is clear. No oropharyngeal exudate or posterior oropharyngeal erythema.  Eyes:     Extraocular Movements: Extraocular movements intact.     Pupils: Pupils are equal, round, and reactive to light.  Cardiovascular:     Rate and Rhythm: Normal rate and regular rhythm.     Heart sounds: Normal heart sounds.  Pulmonary:     Effort: Pulmonary effort is normal.     Breath sounds: Normal breath sounds.  Abdominal:     General: Bowel sounds are normal. There is no distension.     Palpations: Abdomen is soft. There is no mass.     Tenderness: There is no abdominal tenderness. There is no guarding or rebound.     Hernia:  No hernia is present.  Musculoskeletal:     Cervical back: Normal range of motion and neck supple.  Lymphadenopathy:     Cervical: No cervical adenopathy.  Skin:    General: Skin is warm.     Coloration: Skin is not jaundiced or pale.     Findings: No rash.  Neurological:     Mental Status: He is alert and oriented to person, place, and time.     UC Treatments / Results  Labs (all labs ordered are listed, but only abnormal results are displayed) Labs Reviewed  RAPID INFLUENZA A&B ANTIGENS  SARS CORONAVIRUS 2 (TAT 6-24 HRS)    EKG   Radiology No results found.  Procedures Procedures (including critical care time)  Medications Ordered in UC Medications - No data to display  Initial Impression / Assessment and Plan / UC Course  I have reviewed the triage vital signs and the nursing notes.  Pertinent labs & imaging results that were available during my care of the patient were reviewed by me and considered in my medical decision making (see chart for details).     New. Likely viral in nature. Influenza is negative in office. Defers COVIS-19. For now treating with rest, hydration with water, flonase, tessalon, albuterol, zofran. Discussed red flag signs and symptoms.  Final Clinical Impressions(s) / UC Diagnoses   Final diagnoses:  None   Discharge Instructions   None    ED Prescriptions   None    PDMP not reviewed this encounter.   Hughie Closs, [place] 10/13/21 1015

## 2021-10-13 NOTE — ED Triage Notes (Signed)
Pt c/o cough, body aches, vomiting, chills. Started about 3 days ago. Pt states he has had a "low grade fever".

## 2021-10-14 LAB — SARS CORONAVIRUS 2 (TAT 6-24 HRS): SARS Coronavirus 2: NEGATIVE

## 2023-02-24 IMAGING — CR DG WRIST COMPLETE 3+V*R*
4 series · 4 of 4 positions shown · non-contrast
Comparison: None.

CLINICAL DATA: Acute RIGHT wrist pain following injury yesterday.
Initial encounter.

EXAM:
RIGHT WRIST - COMPLETE 3+ VIEW

[wrist pa]
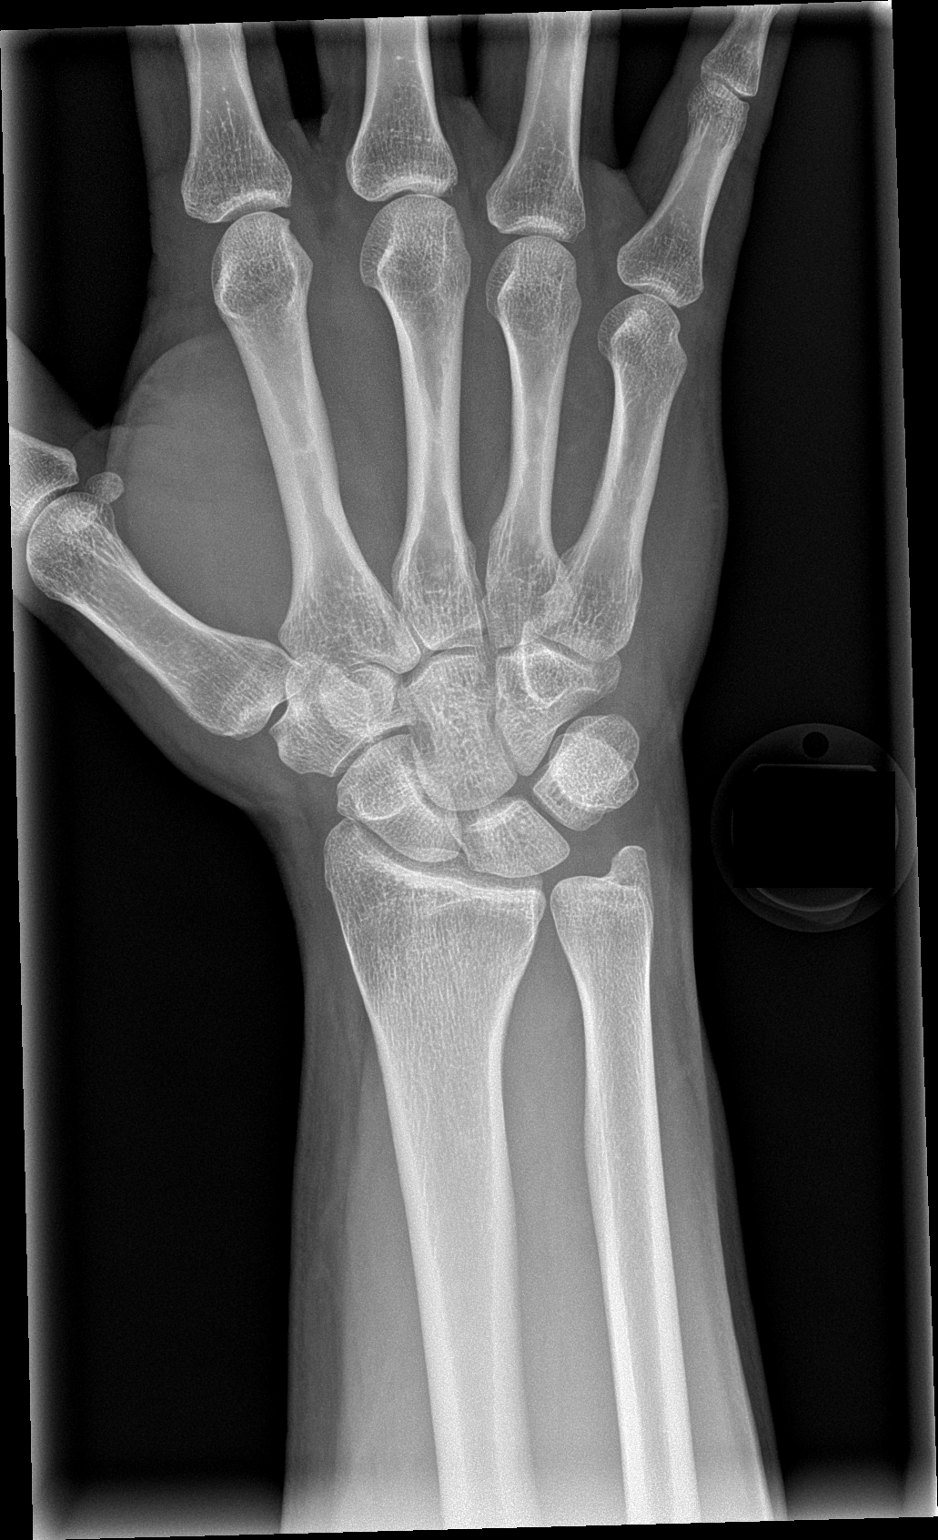

[wrist obl]
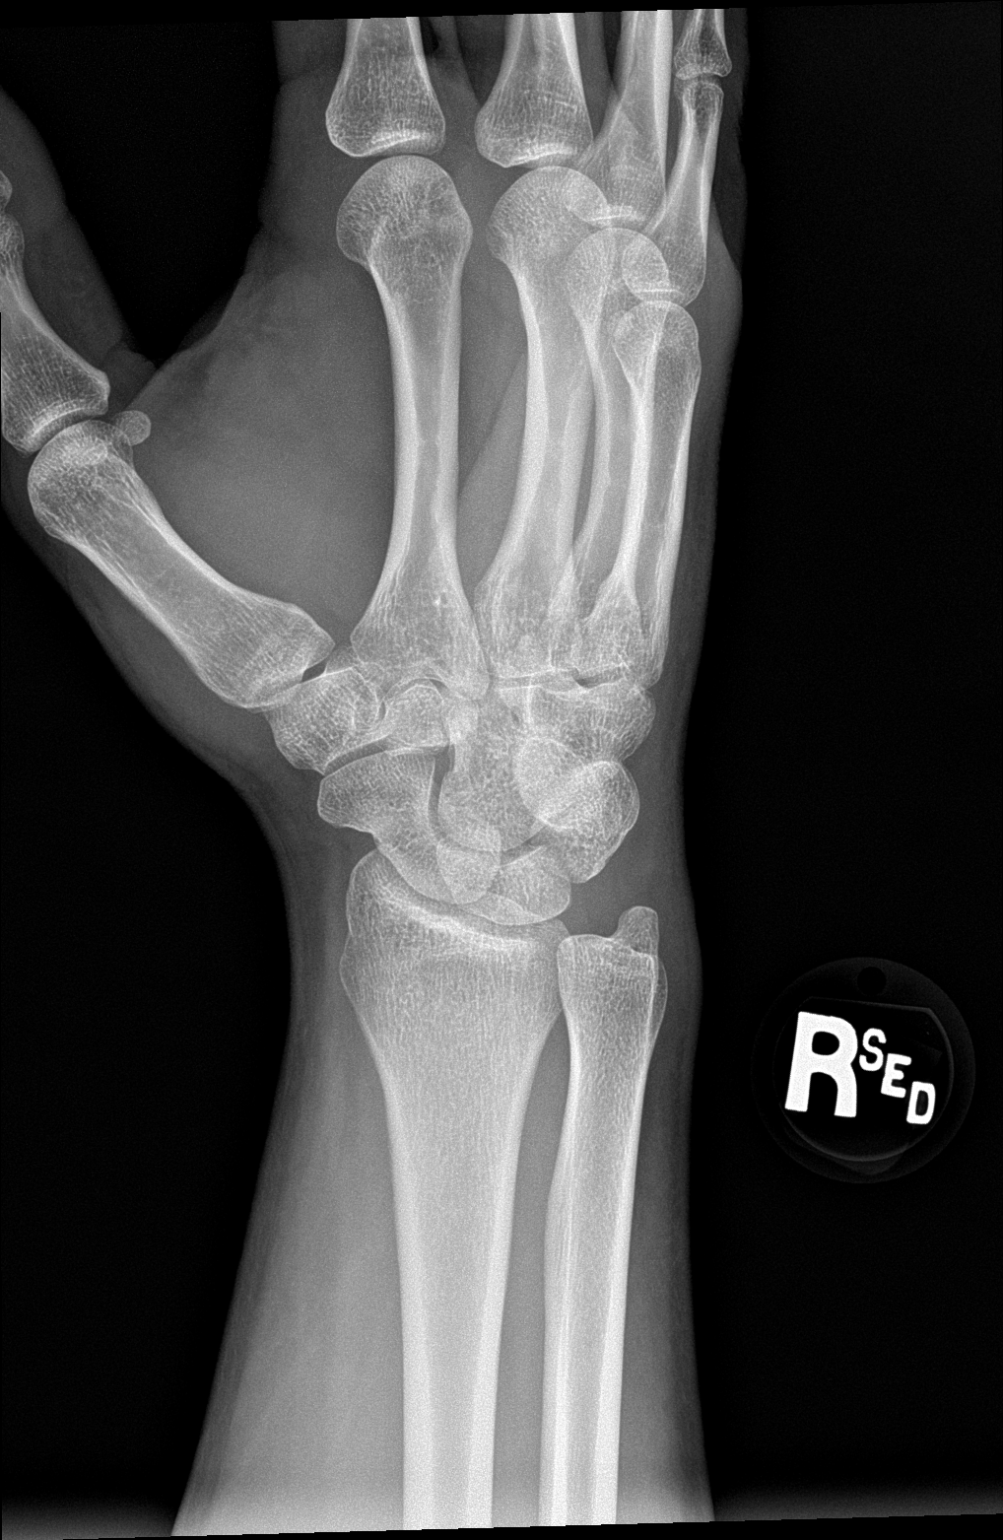

[wrist lat]
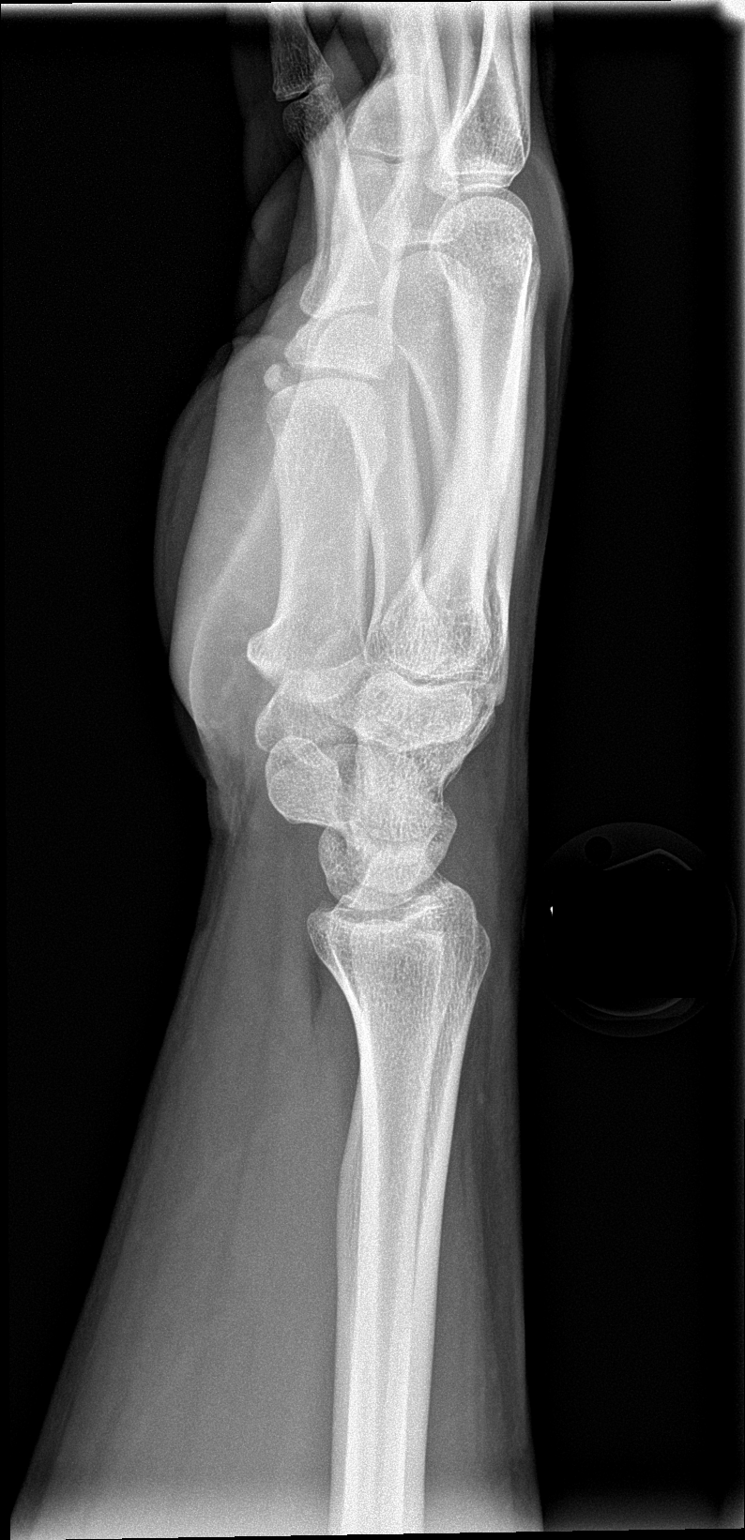

[wrist navicular]
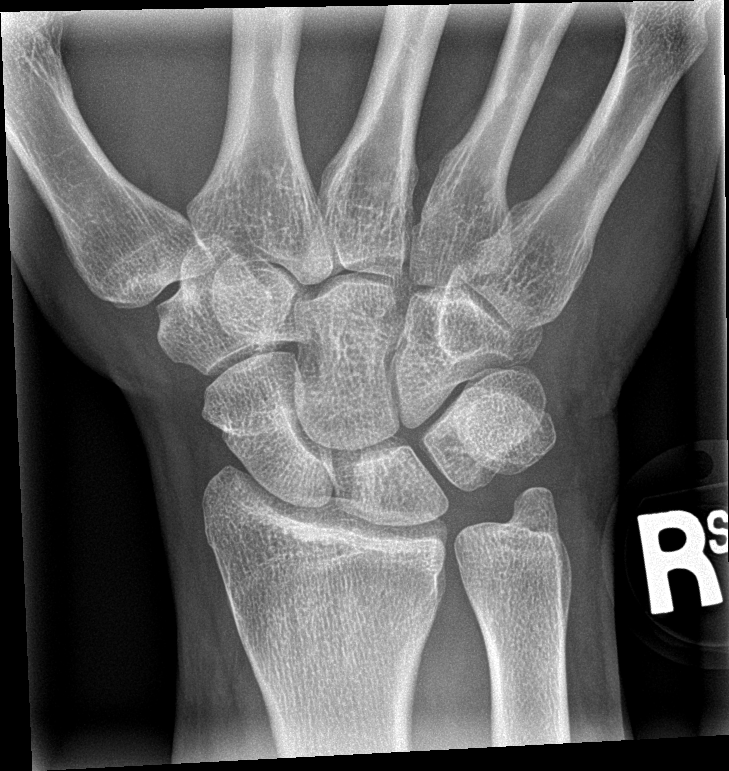

[4 of 4 positions shown; findings below may reference images not displayed]

FINDINGS: There is no evidence of fracture or dislocation. There is no
evidence of arthropathy or other focal bone abnormality. Soft
tissues are unremarkable.
IMPRESSION: Negative.

## 2023-04-19 ENCOUNTER — Ambulatory Visit
Admission: EM | Admit: 2023-04-19 | Discharge: 2023-04-19 | Disposition: A | Discharge status: Home / Self Care | Payer: Self-pay | Attending: Family Medicine | Admitting: Family Medicine

## 2023-04-19 ENCOUNTER — Encounter: Payer: Self-pay | Admitting: Emergency Medicine

## 2023-04-19 DIAGNOSIS — M5416 Radiculopathy, lumbar region: Secondary | ICD-10-CM

## 2023-04-19 DIAGNOSIS — G8929 Other chronic pain: Secondary | ICD-10-CM

## 2023-04-19 MED ORDER — HYDROCODONE-ACETAMINOPHEN 5-325 MG PO TABS: 1.0000 | ORAL_TABLET | ORAL | 0 refills | Status: DC | PRN

## 2023-04-19 MED ORDER — CYCLOBENZAPRINE HCL 10 MG PO TABS: 10.0000 mg | ORAL_TABLET | Freq: Three times a day (TID) | ORAL | 0 refills | Status: DC | PRN

## 2023-04-19 MED ORDER — PREDNISONE 10 MG PO TABS: ORAL_TABLET | ORAL | 0 refills | Status: AC

## 2023-04-19 NOTE — Discharge Instructions (Addendum)
If medication was prescribed, stop by the pharmacy to pick up your prescriptions.  For your  pain, Take 1000 mg Tylenol with 600 mg of ibuprofen 2-3 times a day with muscle relaxer (Flexeril) 2-3 times a day.  Take Norco as needed for pain. Complete your prednisone as prescribed. Apply warm compresses intermittently, as needed.  As pain recedes, begin normal activities slowly as tolerated.  Follow up with primary care provider or an orthopedic provider, if symptoms persist.  Watch for worsening symptoms such as an increasing weakness or loss of sensation, increasing pain and/or the loss of bladder or bowel function. Should any of these occur, go to the emergency department immediately.

## 2023-04-19 NOTE — ED Provider Notes (Signed)
[facility]    CSN: [id number] Arrival date & time: 04/19/23  1344      History   Chief Complaint Chief Complaint  Patient presents with   Back Pain    HPI  HPI Francis Roberts is a 42 y.o. male.   Francis Roberts presents for left low back pain and that started 2 weeks ago.  He has sciatic nerve damage previously. Pain radiates to both hips and down his left leg.  Pain is similar to his previous back pain episodes. Pain is described as throbbing and sharp.  Pain rated 8/10.  Denies dysuria, leg weakness, new numbness, new weakness, new tingling, abdominal pain, chest pain, incontinence, pelvic pain, perianal numbness.   Continues to have  pain with movement. Francis Roberts does not feel like his legs are weak.    Has tried Naprosyn, Motrin and Midol without relief.      History reviewed. No pertinent past medical history.  There are no problems to display for this patient.   History reviewed. No pertinent surgical history.     Home Medications    Prior to Admission medications   Medication Sig Start Date End Date Taking? Authorizing Provider  predniSONE (DELTASONE) 10 MG tablet Take 6 tablets (60 mg total) by mouth daily for 2 days, THEN 4 tablets (40 mg total) daily for 2 days, THEN 2 tablets (20 mg total) daily for 2 days. 04/19/23 04/25/23 Yes Dorthea Maina, DO  albuterol (VENTOLIN HFA) 108 (90 Base) MCG/ACT inhaler Inhale 1-2 puffs into the lungs every 6 (six) hours as needed. 10/13/21   Rushie Chestnut, PA-C  cyclobenzaprine (FLEXERIL) 10 MG tablet Take 1 tablet (10 mg total) by mouth 3 (three) times daily as needed for muscle spasms. 04/19/23   Tinea Nobile, Seward Meth, DO  fluticasone (FLONASE) 50 MCG/ACT nasal spray Place 2 sprays into both nostrils daily. 10/13/21   Rushie Chestnut, PA-C  HYDROcodone-acetaminophen (NORCO) 5-325 MG tablet Take 1-2 tablets by mouth every 4 (four) hours as needed for moderate pain. 04/19/23   Katha Cabal, DO    Family History History  reviewed. No pertinent family history.  Social History Social History   Tobacco Use   Smoking status: Every Day    Packs/day: .5    Types: Cigarettes   Smokeless tobacco: Never  Vaping Use   Vaping Use: Never used  Substance Use Topics   Alcohol use: No   Drug use: No     Allergies   Patient has no known allergies.   Review of Systems Review of Systems: egative unless otherwise stated in HPI.      Physical Exam Triage Vital Signs ED Triage Vitals  Enc Vitals Group     BP 04/19/23 1436 122/80     Pulse Rate 04/19/23 1436 72     Resp 04/19/23 1436 15     Temp 04/19/23 1436 98.1 F (36.7 C)     Temp Source 04/19/23 1436 Oral     SpO2 04/19/23 1436 98 %     Weight 04/19/23 1433 160 lb (72.6 kg)     Height 04/19/23 1433 5\' 9"  (1.753 m)     Head Circumference --      Peak Flow --      Pain Score 04/19/23 1433 7     Pain Loc --      Pain Edu? --      Excl. in [place]? --    No data found.  Updated Vital Signs BP 122/80 (BP Location: Right  Arm)   Pulse 72   Temp 98.1 F (36.7 C) (Oral)   Resp 15   Ht 5\' 9"  (1.753 m)   Wt 72.6 kg   SpO2 98%   BMI 23.63 kg/m   Visual Acuity Right Eye Distance:   Left Eye Distance:   Bilateral Distance:    Right Eye Near:   Left Eye Near:    Bilateral Near:     Physical Exam GEN: well appearing male in no acute distress  CVS: well perfused  RESP: speaking in full sentences without pause, no respiratory distress  MSK:  Lumbar spine: - Inspection: no gross deformity or asymmetry, swelling or ecchymosis. No skin changes  - Palpation: L4-L5 TTP over the spinous processes, +bilateral lumbar paraspinal muscle tenderness, no tenderness over piriformis,  no SI joint tenderness bilaterally - ROM: limited ROM of the lumbar spine due to acute pain  - Strength: 5/5 strength of lower extremity in L4-S1 nerve root distributions b/l - Neuro: sensation intact in the L4-S1 nerve root distribution b/l, 2+ L4 and S1 reflexes - Special  testing: positive straight leg raise on the left  SKIN: warm, dry, no overly skin rash or erythema    UC Treatments / Results  Labs (all labs ordered are listed, but only abnormal results are displayed) Labs Reviewed - No data to display  EKG   Radiology No results found.   Procedures Procedures (including critical care time)  Medications Ordered in UC Medications - No data to display  Initial Impression / Assessment and Plan / UC Course  I have reviewed the triage vital signs and the nursing notes.  Pertinent labs & imaging results that were available during my care of the patient were reviewed by me and considered in my medical decision making (see chart for details).      Pt is a 42 y.o.  male with acute on chronic lower back pain that started 2 weeks ago. Offered xray but pt declined. This is reasonable as he has not had recent trauma. Doubt acute fracture. Unable to given IM Toradol, Tylenol or Motrin here due to recent use of Midol and Naprosyn. I suspect acute on chronic lumbar radiculopathy.  Patient to gradually return to normal activities, as tolerated and continue ordinary activities within the limits permitted by pain. Prescribed prednisone taper, Norco for severe pain  and muscle relaxer.  Lidocaine patches PRN for multimodal pain relief. Counseled patient on red flag symptoms and when to seek immediate care.  No red flags suggesting cauda equina syndrome or progressive major motor weakness. Patient to follow up with orthopedic provider if symptoms do not improve with conservative treatment.  Return and ED precautions given.   Discussed MDM, treatment plan and plan for follow-up with patient who agrees with plan.   Final Clinical Impressions(s) / UC Diagnoses   Final diagnoses:  Lumbar radiculopathy, acute  Chronic left-sided low back pain with left-sided sciatica     Discharge Instructions      If medication was prescribed, stop by the pharmacy to pick up  your prescriptions.  For your  pain, Take 1000 mg Tylenol with 600 mg of ibuprofen 2-3 times a day with muscle relaxer (Flexeril) 2-3 times a day.  Take Norco as needed for pain. Complete your prednisone as prescribed. Apply warm compresses intermittently, as needed.  As pain recedes, begin normal activities slowly as tolerated.  Follow up with primary care provider or an orthopedic provider, if symptoms persist.  Watch for worsening symptoms  such as an increasing weakness or loss of sensation, increasing pain and/or the loss of bladder or bowel function. Should any of these occur, go to the emergency department immediately.        ED Prescriptions     Medication Sig Dispense Auth. Provider   predniSONE (DELTASONE) 10 MG tablet Take 6 tablets (60 mg total) by mouth daily for 2 days, THEN 4 tablets (40 mg total) daily for 2 days, THEN 2 tablets (20 mg total) daily for 2 days. 24 tablet Dasani Crear, DO   cyclobenzaprine (FLEXERIL) 10 MG tablet Take 1 tablet (10 mg total) by mouth 3 (three) times daily as needed for muscle spasms. 30 tablet Roselynn Whitacre, DO   HYDROcodone-acetaminophen (NORCO) 5-325 MG tablet Take 1-2 tablets by mouth every 4 (four) hours as needed for moderate pain. 10 tablet Katha Cabal, DO      I have reviewed the PDMP during this encounter.   Katha Cabal, DO 04/22/23 [phone]

## 2023-04-19 NOTE — ED Triage Notes (Signed)
Patient c/o lower back pain and bilateral hip pain that started 2 weeks ago.  Patient reports spasms in his back that started this morning. Patient denies injury or fall.

## 2024-02-16 ENCOUNTER — Ambulatory Visit
Admission: EM | Admit: 2024-02-16 | Discharge: 2024-02-16 | Disposition: A | Discharge status: Home / Self Care | Payer: Self-pay | Attending: Emergency Medicine | Admitting: Emergency Medicine

## 2024-02-16 DIAGNOSIS — Z202 Contact with and (suspected) exposure to infections with a predominantly sexual mode of transmission: Secondary | ICD-10-CM | POA: Insufficient documentation

## 2024-02-16 LAB — URINALYSIS, W/ REFLEX TO CULTURE (INFECTION SUSPECTED)
Bilirubin Urine: NEGATIVE
Glucose, UA: NEGATIVE mg/dL
Ketones, ur: NEGATIVE mg/dL
Leukocytes,Ua: NEGATIVE
Nitrite: NEGATIVE
Protein, ur: NEGATIVE mg/dL
Specific Gravity, Urine: 1.02 (ref 1.005–1.030)
pH: 7 (ref 5.0–8.0)

## 2024-02-16 MED ORDER — PHENAZOPYRIDINE HCL 200 MG PO TABS: 200.0000 mg | ORAL_TABLET | Freq: Three times a day (TID) | ORAL | 0 refills | Status: AC

## 2024-02-16 MED ORDER — DOXYCYCLINE HYCLATE 100 MG PO CAPS: 100.0000 mg | ORAL_CAPSULE | Freq: Two times a day (BID) | ORAL | 0 refills | Status: AC

## 2024-02-16 NOTE — Discharge Instructions (Addendum)
 Your urinalysis showed that you had white blood cells in your urine but it did not show any other evidence of a urinary tract infection.  I suspect they are most likely coming from your chlamydia exposure.  However, I will send her urine for culture and if an infection grows out we will treat you at that time.  I am going to treat you for chlamydia exposure with doxycycline, 100 mg twice daily with food, for 7 days.  I muscular prescribe you Pyridium that you can use for urinary discomfort.  You can take it every 8 hours as needed.  I will turn your urine a very vivid red-orange.  Avoid intercourse, or unprotected intercourse, until after you and your partner have both completed treatment for chlamydia.

## 2024-02-16 NOTE — ED Provider Notes (Signed)
 [facility]    CSN: [id number] Arrival date & time: 02/16/24  1758      History   Chief Complaint Chief Complaint  Patient presents with   SEXUALLY TRANSMITTED DISEASE    HPI Francis Roberts is a 43 y.o. male.   HPI  43 year old male with no significant past medical history presents with request for STI testing in the setting of his wife recently testing positive for chlamydia.  He reports that he has been experiencing some burning with urination for the last 4 days but no urgency, frequency, or blood.  He also denies any penile discharge or penile rashes or lesions.  History reviewed. No pertinent past medical history.  There are no active problems to display for this patient.   History reviewed. No pertinent surgical history.     Home Medications    Prior to Admission medications   Medication Sig Start Date End Date Taking? Authorizing Provider  doxycycline (VIBRAMYCIN) 100 MG capsule Take 1 capsule (100 mg total) by mouth 2 (two) times daily for 7 days. 02/16/24 02/23/24 Yes Becky Augusta, NP  phenazopyridine (PYRIDIUM) 200 MG tablet Take 1 tablet (200 mg total) by mouth 3 (three) times daily. 02/16/24  Yes Becky Augusta, NP    Family History History reviewed. No pertinent family history.  Social History Social History   Tobacco Use   Smoking status: Every Day    Current packs/day: 0.50    Types: Cigarettes   Smokeless tobacco: Never  Vaping Use   Vaping status: Never Used  Substance Use Topics   Alcohol use: No   Drug use: No     Allergies   Patient has no known allergies.   Review of Systems Review of Systems  Constitutional:  Negative for fever.  Genitourinary:  Positive for dysuria. Negative for frequency, hematuria, penile discharge, penile pain, penile swelling, scrotal swelling, testicular pain and urgency.     Physical Exam Triage Vital Signs ED Triage Vitals  Encounter Vitals Group     BP      Systolic BP Percentile       Diastolic BP Percentile      Pulse      Resp      Temp      Temp src      SpO2      Weight      Height      Head Circumference      Peak Flow      Pain Score      Pain Loc      Pain Education      Exclude from Growth Chart    No data found.  Updated Vital Signs BP 137/87 (BP Location: Right Arm)   Pulse 63   Temp 99 F (37.2 C) (Oral)   Resp 16   Ht 5\' 9"  (1.753 m)   Wt 165 lb (74.8 kg)   SpO2 97%   BMI 24.37 kg/m   Visual Acuity Right Eye Distance:   Left Eye Distance:   Bilateral Distance:    Right Eye Near:   Left Eye Near:    Bilateral Near:     Physical Exam Vitals and nursing note reviewed.  Constitutional:      Appearance: Normal appearance. He is not ill-appearing.  HENT:     Head: Normocephalic and atraumatic.  Genitourinary:    Penis: Normal.      Testes: Normal.  Skin:    General: Skin is warm and dry.  Capillary Refill: Capillary refill takes less than 2 seconds.     Findings: No rash.  Neurological:     General: No focal deficit present.     Mental Status: He is alert and oriented to person, place, and time.      UC Treatments / Results  Labs (all labs ordered are listed, but only abnormal results are displayed) Labs Reviewed  URINALYSIS, W/ REFLEX TO CULTURE (INFECTION SUSPECTED) - Abnormal; Notable for the following components:      Result Value   Hgb urine dipstick TRACE (*)    Bacteria, UA FEW (*)    All other components within normal limits  URINE CULTURE  CYTOLOGY, (ORAL, ANAL, URETHRAL) ANCILLARY ONLY    EKG   Radiology No results found.  Procedures Procedures (including critical care time)  Medications Ordered in UC Medications - No data to display  Initial Impression / Assessment and Plan / UC Course  I have reviewed the triage vital signs and the nursing notes.  Pertinent labs & imaging results that were available during my care of the patient were reviewed by me and considered in my medical decision  making (see chart for details).   Patient is a nontoxic-appearing 43 year old male presenting for evaluation of dysuria in the setting of recent chlamydia exposure.  He denies any penile discharge.  Genitourinary exam reveals no discharge from the urethral meatus.  Glans penis and penile shaft are free of rashes or lesions.  No pain with palpation of either testicle and testicles are normal volume.  No pain with palpation of epididymis complex bilaterally or with palpation of the spermatic cord.  I suspect the patient's dysuria is secondary to chlamydia but I will order urinalysis to evaluate the presence of concomitant UTI.  I will also order a cytology swab to evaluate for the presence of gonorrhea, chlamydia, or trichomonas.  Urinalysis shows trace hemoglobin but no leukocyte esterase, nitrates, or protein.  Reflex microscopy shows 11-20 WBCs with few bacteria and WBC clumps present.  Urine will reflex to culture.  I suspect that the white blood cells in the patient's urine are secondary to possible chlamydia infection, given that his wife is chlamydia positive.  I will treat him for his chlamydia exposure with doxycycline 100 mg twice daily for 7 days.  I will hold on treatment for UTI pending the culture results.   Final Clinical Impressions(s) / UC Diagnoses   Final diagnoses:  Exposure to chlamydia     Discharge Instructions      Your urinalysis showed that you had white blood cells in your urine but it did not show any other evidence of a urinary tract infection.  I suspect they are most likely coming from your chlamydia exposure.  However, I will send her urine for culture and if an infection grows out we will treat you at that time.  I am going to treat you for chlamydia exposure with doxycycline, 100 mg twice daily with food, for 7 days.  I muscular prescribe you Pyridium that you can use for urinary discomfort.  You can take it every 8 hours as needed.  I will turn your urine a  very vivid red-orange.  Avoid intercourse, or unprotected intercourse, until after you and your partner have both completed treatment for chlamydia.     ED Prescriptions     Medication Sig Dispense Auth. Provider   doxycycline (VIBRAMYCIN) 100 MG capsule Take 1 capsule (100 mg total) by mouth 2 (two) times daily for  7 days. 14 capsule Becky Augusta, NP   phenazopyridine (PYRIDIUM) 200 MG tablet Take 1 tablet (200 mg total) by mouth 3 (three) times daily. 6 tablet Becky Augusta, NP      PDMP not reviewed this encounter.   Becky Augusta, NP 02/16/24 [phone]

## 2024-02-16 NOTE — ED Triage Notes (Signed)
 Pt presents to UC for STD testing, c/o burning w/urination x4 days. States wife recently tested + chlamydia. Denies any penile discharge.

## 2024-02-17 LAB — CYTOLOGY, (ORAL, ANAL, URETHRAL) ANCILLARY ONLY
Chlamydia: POSITIVE — AB
Comment: NEGATIVE
Comment: NEGATIVE
Comment: NORMAL
Neisseria Gonorrhea: NEGATIVE
Trichomonas: NEGATIVE

## 2024-02-18 LAB — URINE CULTURE: Culture: NO GROWTH
